# Patient Record
Sex: Female | Born: 1985 | Race: White | Hispanic: No | Marital: Single | Smoking: Never smoker
Health system: Southern US, Community
[De-identification: ages and names within clinical notes are randomized; demographics above are authoritative.]

## PROBLEM LIST (undated history)

## (undated) DIAGNOSIS — F329 Major depressive disorder, single episode, unspecified: Secondary | ICD-10-CM

## (undated) DIAGNOSIS — F32A Depression, unspecified: Secondary | ICD-10-CM

---

## 2007-08-01 ENCOUNTER — Emergency Department (HOSPITAL_COMMUNITY): Admission: EM | Admit: 2007-08-01 | Discharge: 2007-08-01 | Payer: Self-pay | Admitting: Family Medicine

## 2007-08-11 ENCOUNTER — Other Ambulatory Visit: Admission: RE | Admit: 2007-08-11 | Discharge: 2007-08-11 | Payer: Self-pay | Admitting: Obstetrics & Gynecology

## 2007-09-18 ENCOUNTER — Emergency Department (HOSPITAL_COMMUNITY): Admission: EM | Admit: 2007-09-18 | Discharge: 2007-09-18 | Payer: Self-pay | Admitting: Emergency Medicine

## 2008-03-27 ENCOUNTER — Inpatient Hospital Stay (HOSPITAL_COMMUNITY): Admission: EM | Admit: 2008-03-27 | Discharge: 2008-03-28 | Payer: Self-pay | Admitting: Emergency Medicine

## 2008-03-28 ENCOUNTER — Ambulatory Visit: Payer: Self-pay | Admitting: Psychiatry

## 2008-03-28 ENCOUNTER — Inpatient Hospital Stay (HOSPITAL_COMMUNITY): Admission: RE | Admit: 2008-03-28 | Discharge: 2008-03-29 | Payer: Self-pay | Admitting: Psychiatry

## 2008-04-13 ENCOUNTER — Ambulatory Visit (HOSPITAL_COMMUNITY): Payer: Self-pay | Admitting: Marriage and Family Therapist

## 2008-04-19 ENCOUNTER — Ambulatory Visit (HOSPITAL_COMMUNITY): Payer: Self-pay | Admitting: Marriage and Family Therapist

## 2008-04-28 ENCOUNTER — Ambulatory Visit (HOSPITAL_COMMUNITY): Payer: Self-pay | Admitting: Marriage and Family Therapist

## 2008-05-04 ENCOUNTER — Ambulatory Visit (HOSPITAL_COMMUNITY): Payer: Self-pay | Admitting: Marriage and Family Therapist

## 2008-05-12 ENCOUNTER — Ambulatory Visit (HOSPITAL_COMMUNITY): Payer: Self-pay | Admitting: Marriage and Family Therapist

## 2008-05-22 ENCOUNTER — Ambulatory Visit (HOSPITAL_COMMUNITY): Payer: Self-pay | Admitting: Marriage and Family Therapist

## 2008-06-07 ENCOUNTER — Ambulatory Visit (HOSPITAL_COMMUNITY): Payer: Self-pay | Admitting: Marriage and Family Therapist

## 2008-06-14 ENCOUNTER — Ambulatory Visit (HOSPITAL_COMMUNITY): Payer: Self-pay | Admitting: Marriage and Family Therapist

## 2008-06-26 ENCOUNTER — Ambulatory Visit (HOSPITAL_COMMUNITY): Payer: Self-pay | Admitting: Marriage and Family Therapist

## 2008-07-10 ENCOUNTER — Ambulatory Visit (HOSPITAL_COMMUNITY): Payer: Self-pay | Admitting: Marriage and Family Therapist

## 2008-07-26 ENCOUNTER — Ambulatory Visit (HOSPITAL_COMMUNITY): Payer: Self-pay | Admitting: Marriage and Family Therapist

## 2008-08-09 ENCOUNTER — Ambulatory Visit (HOSPITAL_COMMUNITY): Payer: Self-pay | Admitting: Marriage and Family Therapist

## 2008-08-23 ENCOUNTER — Ambulatory Visit (HOSPITAL_COMMUNITY): Payer: Self-pay | Admitting: Marriage and Family Therapist

## 2008-09-06 ENCOUNTER — Ambulatory Visit (HOSPITAL_COMMUNITY): Payer: Self-pay | Admitting: Marriage and Family Therapist

## 2008-09-18 ENCOUNTER — Ambulatory Visit (HOSPITAL_COMMUNITY): Payer: Self-pay | Admitting: Marriage and Family Therapist

## 2008-09-28 ENCOUNTER — Other Ambulatory Visit: Admission: RE | Admit: 2008-09-28 | Discharge: 2008-09-28 | Payer: Self-pay | Admitting: Obstetrics and Gynecology

## 2008-11-02 ENCOUNTER — Ambulatory Visit (HOSPITAL_COMMUNITY): Payer: Self-pay | Admitting: Marriage and Family Therapist

## 2008-11-30 ENCOUNTER — Ambulatory Visit (HOSPITAL_COMMUNITY): Payer: Self-pay | Admitting: Marriage and Family Therapist

## 2009-01-22 ENCOUNTER — Ambulatory Visit (HOSPITAL_COMMUNITY): Payer: Self-pay | Admitting: Licensed Clinical Social Worker

## 2009-11-08 IMAGING — CR DG CHEST 1V PORT
1 series · 1 of 1 positions shown · non-contrast
Comparison: none

CLINICAL DATA: Chest pain

Portable chest at [DATE]:
No previous for comparison. The heart size and mediastinal contours are within
normal limits.  Both lungs are clear.  The visualized skeletal structures are
unremarkable.

[view not recorded]
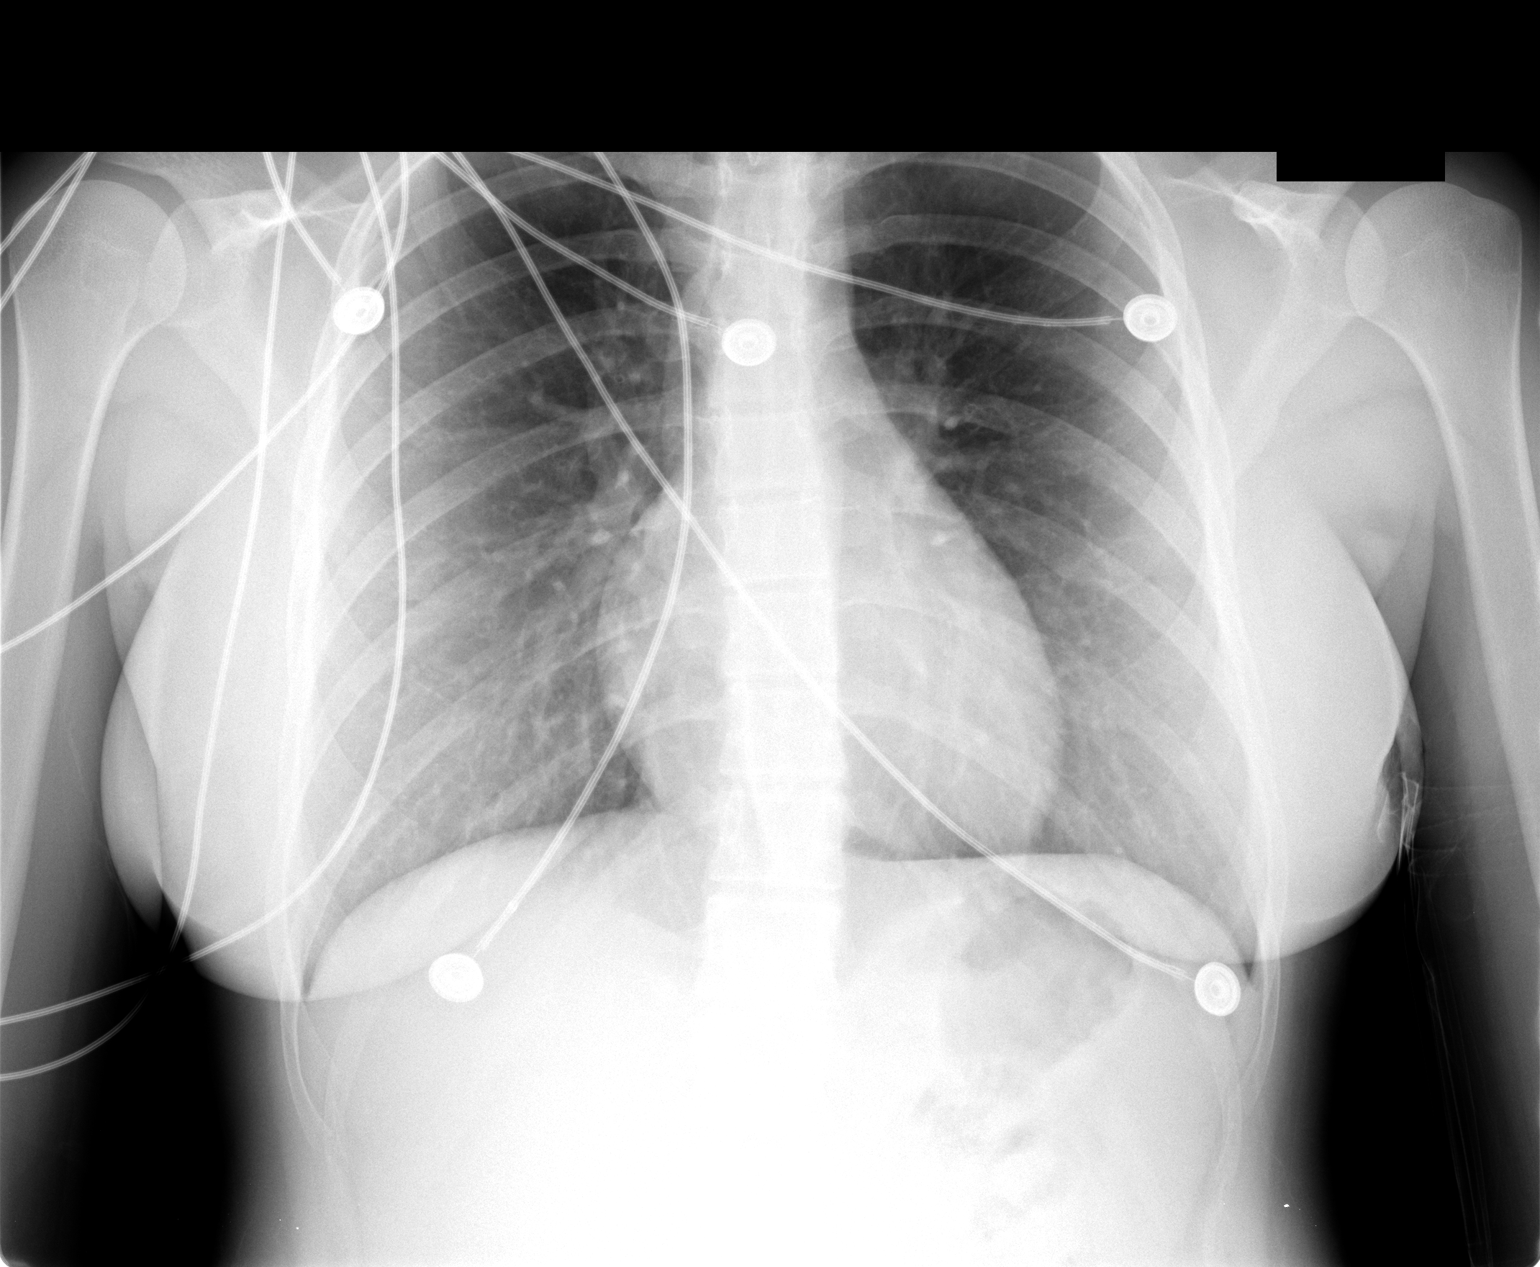

[1 of 1 positions shown; findings below may reference images not displayed]

IMPRESSION: 1. No active cardiopulmonary disease.

## 2009-12-26 IMAGING — CR DG FOOT COMPLETE 3+V*R*
3 series · 3 of 3 positions shown · non-contrast
Comparison: none

CLINICAL DATA: Fell

[t foot ap right]
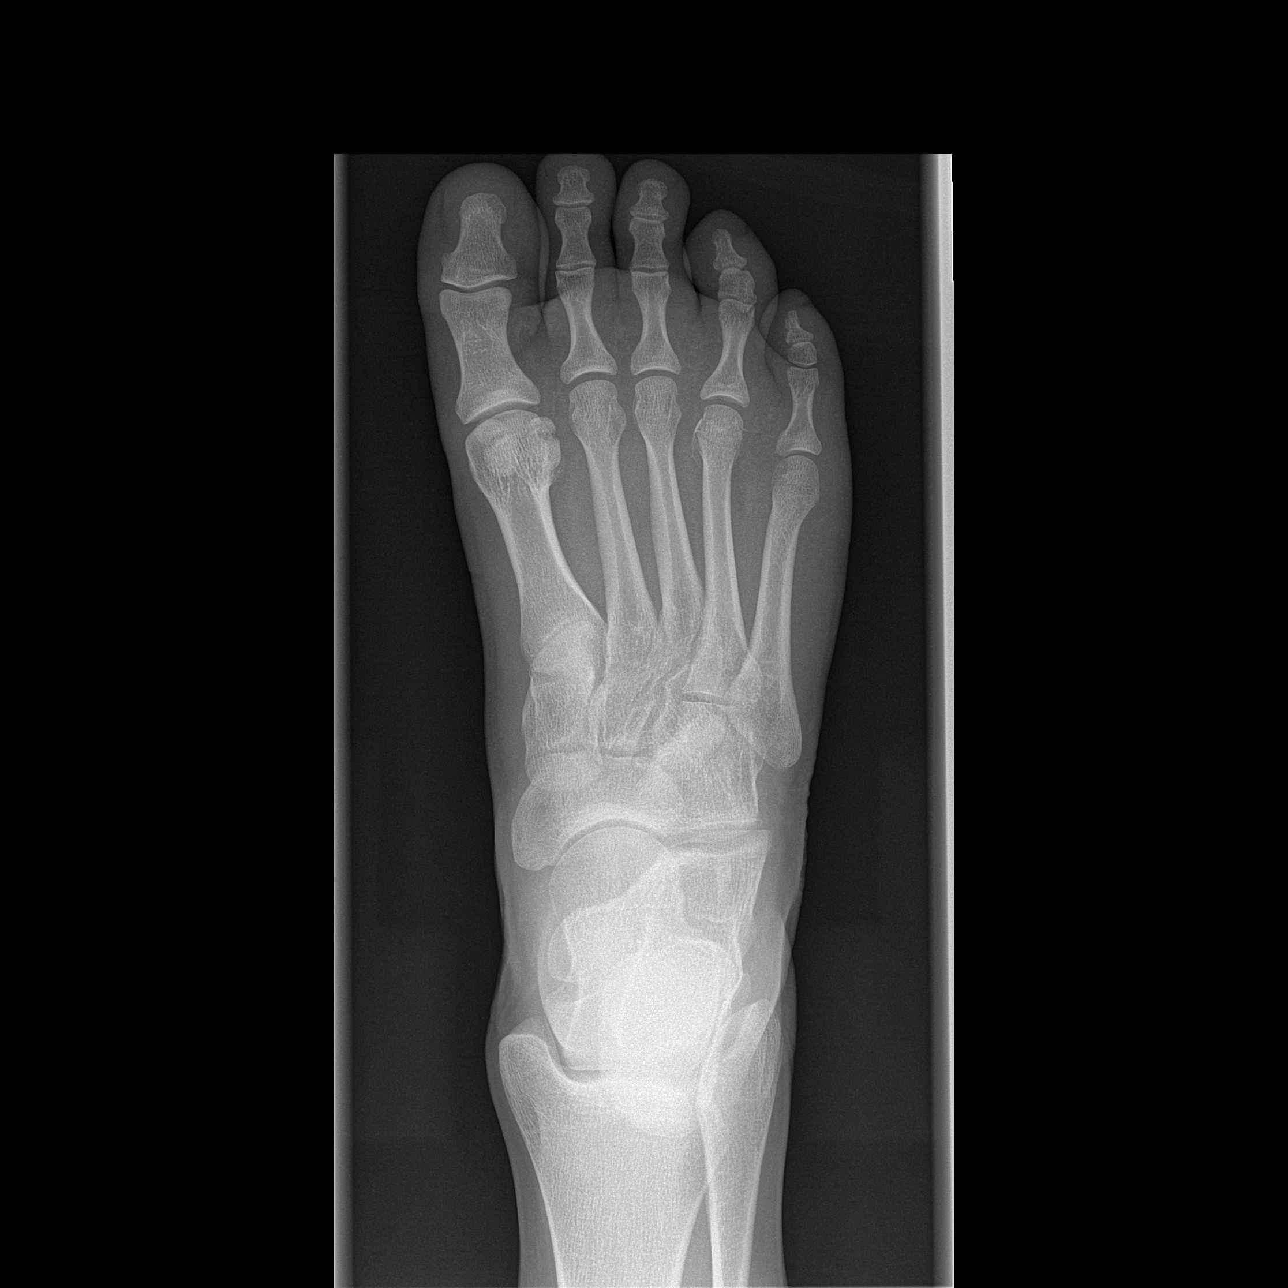

[t foot oblique right]
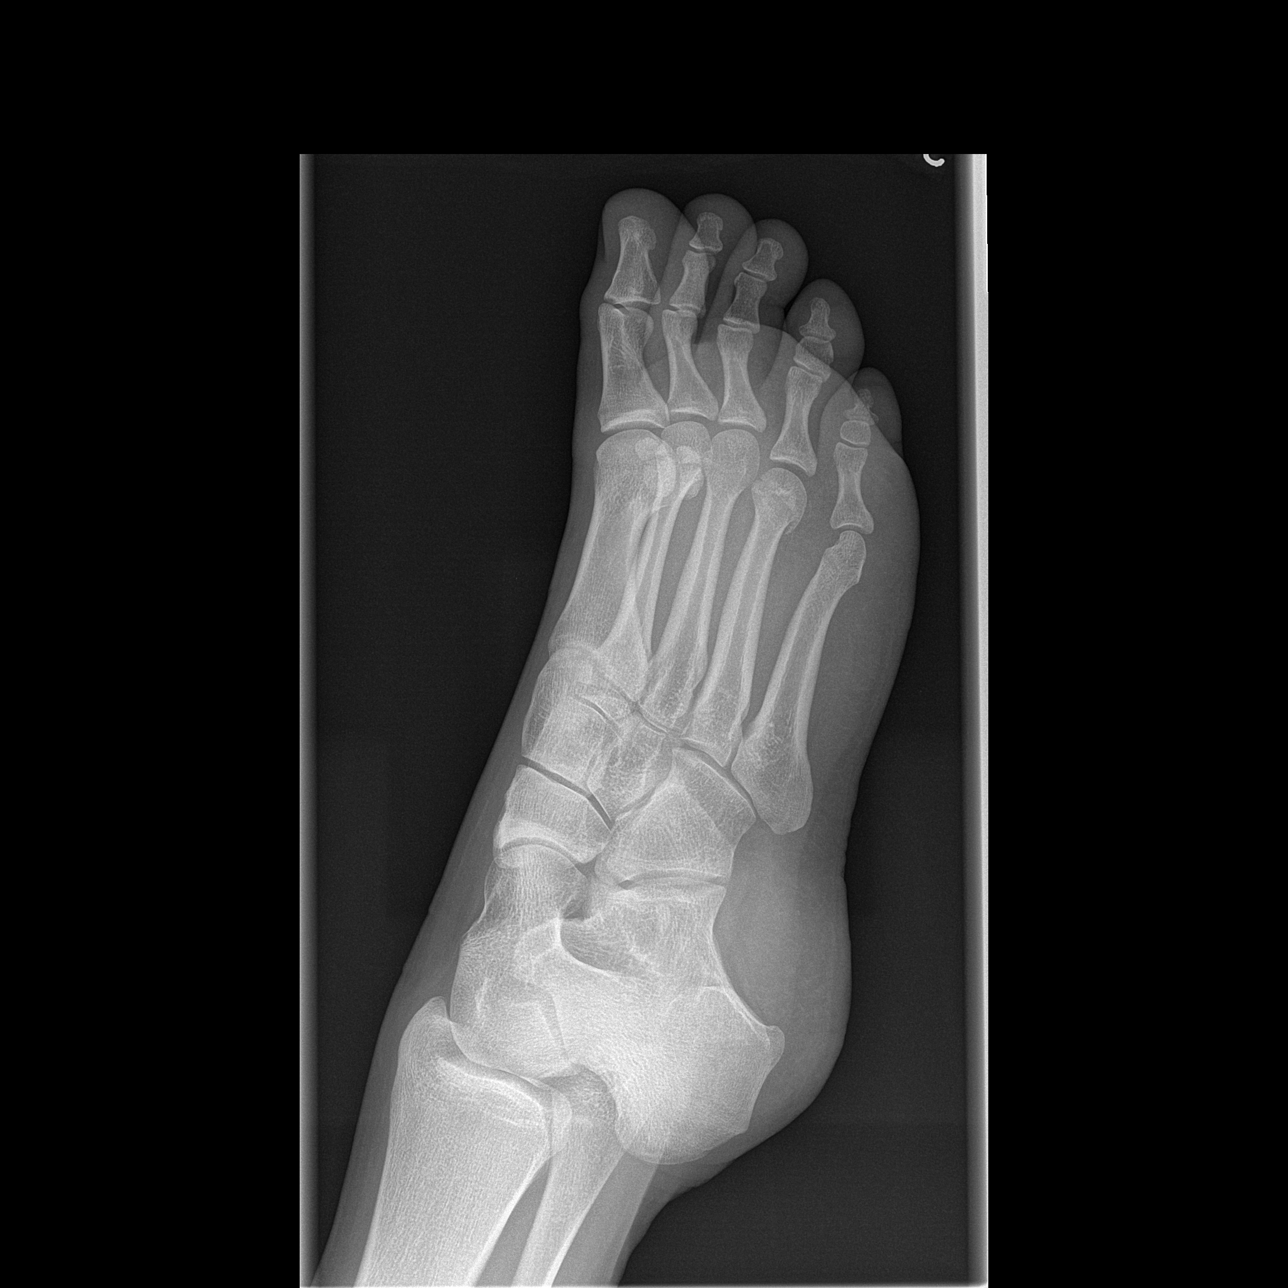

[t foot lat right *]
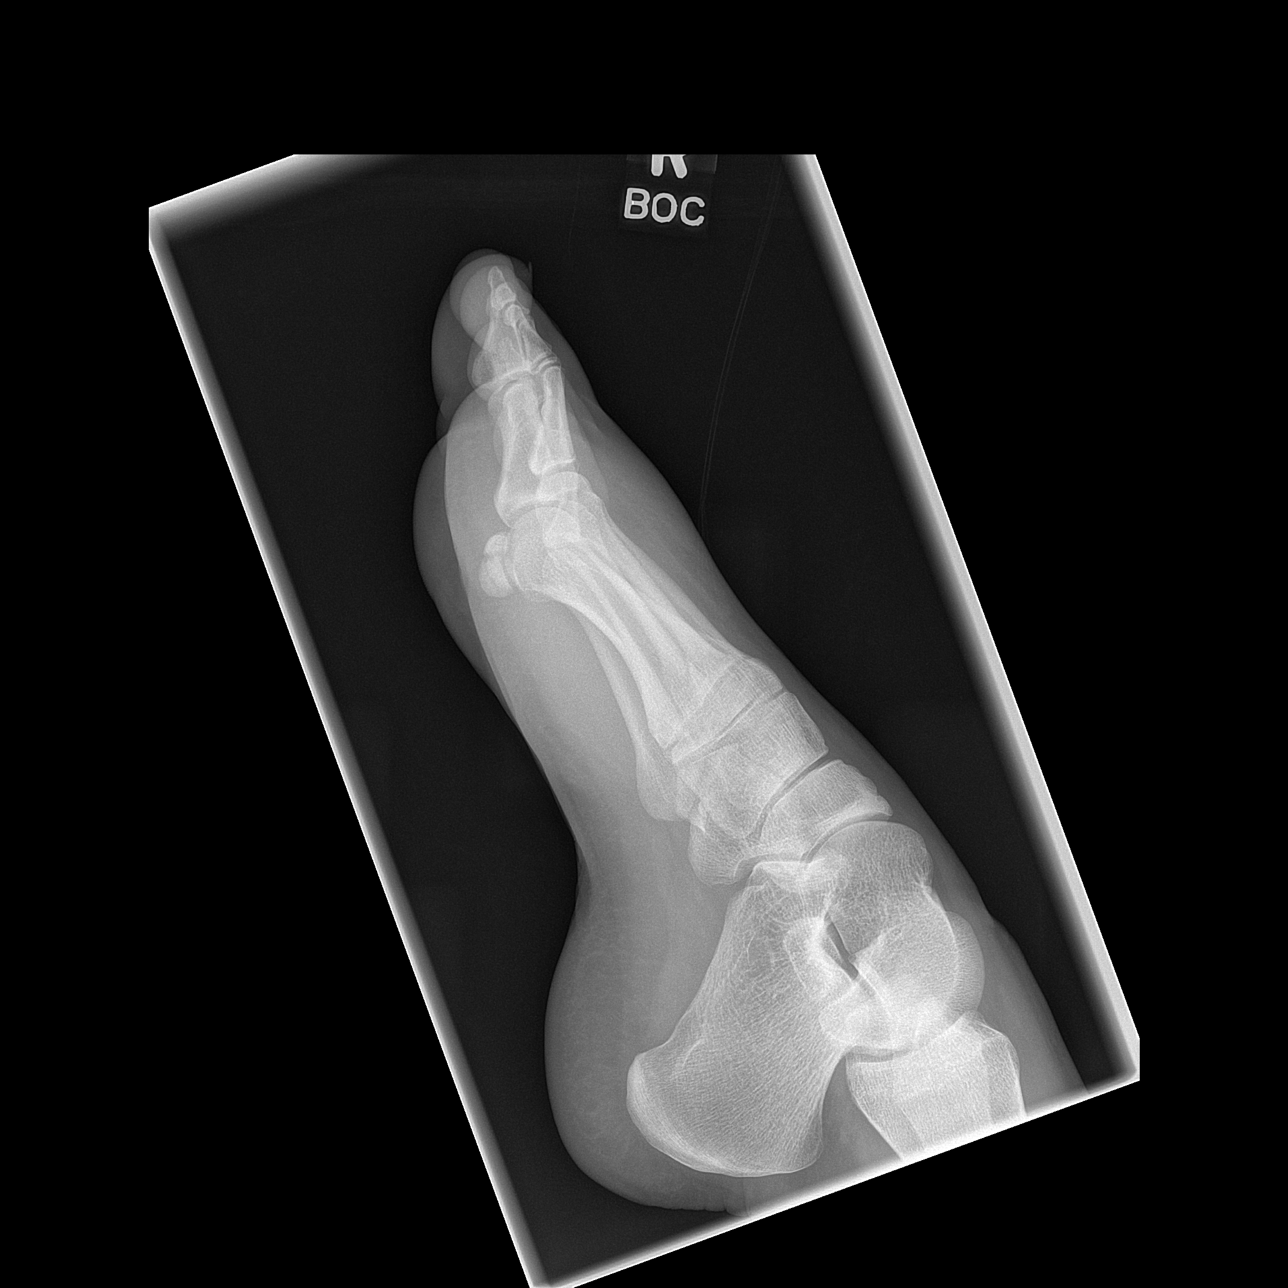

[3 of 3 positions shown; findings below may reference images not displayed]

Right foot three-view:

No previous for comparison. There is a minimally comminuted transverse fracture
across the head of the fourth metatarsal, with less than 1 mm displacement of
major fracture fragments. No significant angulation deformity. No other bone
abnormality seen. Normal alignment and mineralization. No significant
degenerative change.
IMPRESSION: 1. Minimally displaced fracture, head of the fourth metatarsal.
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
## 2011-02-04 NOTE — Discharge Summary (Signed)
Jillian [name], Jillian [name]                   ACCOUNT NO.:  [id number]   MEDICAL RECORD NO.:  [id number]          PATIENT TYPE:  INP   LOCATION:  1503                         FACILITY:  [facility]   PHYSICIAN:  Eduard Clos, MDDATE OF BIRTH:  1986-05-04   DATE OF ADMISSION:  03/26/2008  DATE OF DISCHARGE:                               DISCHARGE SUMMARY   COURSE IN THE HOSPITAL:  A 25 year old female with known history of  depression and chronic urticaria was brought in the ER after patient had  taken 5 tablets of Unisom.  Patient was admitted to medical floor.  On  admission, patient's LFTs were found to be mildly elevated at around AST  being 63 and ALT 42 with a total bilirubin of 1.8 and PT/INR is 13.4 and  1.  Acetaminophen levels and salicylates and are nondetectable.  Pregnancy screen was negative.  Since patient used to take off and on  Tylenol level p.r.n., poison control was contacted and poison control  advised to start her on Mucomyst orally.  Patient was placed on 1:1 with  a sitter with suicide precautions.  Psychiatry consult obtain per Dr.  Jeanie Sewer psychiatrist.  Patient to be transferred once medically stable  to behavioral health.  An EKG also was done which did not show any acute  findings.  Patient had a normal sinus rhythm in the EKG.  Subsequent to  age, patient's AST and ATL became normal and PT/INR was also normal and  have contacted poison control who advised the Mucomyst could be  discontinued during this day.  The Mucomyst was changed to IV because  she was not able to take orally.  At this time, patient is having no  nausea, vomiting or any abdominal pain.  Patient also had mild skin rash  in the both knee area and in the fingers for which an additional dose of  hydroxyzine was given.  The patient did not complain of any difficulty  swallowing or tongue swelling or breathing problems.  After the dose of  hydroxyzine which was given additionally to her regular  scheduled dose,  her rash became better and there was no itching.  At this time, patient  is medically stable to be transferred to behavioral health for further  management.   FINAL DIAGNOSES:  1. Status post Unisom overdose.  2. Abnormal liver function test which has improved.  3. Chronic urticaria.  4. Depression.   MEDICATION AT DISCHARGE:  1. Lexapro 20 mg p.o. daily.  2. Hydroxyzine 25 mg p.o. 3 times daily and 15 mg p.o. at bedtime.   PLAN:  Patient is to be transferred to behavioral health for further  management per Dr. Jeanie Sewer psychiatrist.  Patient's family aware of  the plan.      Eduard Clos, MD  Electronically Signed     ANK/MEDQ  D:  03/28/2008  T:  03/28/2008  Job:  [id number]

## 2011-02-04 NOTE — Consult Note (Signed)
[name], [name]                   ACCOUNT NO.:  [id number]   MEDICAL RECORD NO.:  [id number]          PATIENT TYPE:  INP   LOCATION:  1503                         FACILITY:  [facility]   PHYSICIAN:  Antonietta Breach, M.D.  DATE OF BIRTH:  05-09-1986   DATE OF CONSULTATION:  03/27/2008  DATE OF DISCHARGE:                                 CONSULTATION   ADDENDUM   FAMILY PSYCHIATRIC HISTORY:  None known.   SOCIAL HISTORY:  Please see the above.  The patient was adopted.  She is  single.  She has no children.  She is a Consulting civil engineer at [employer] in  history.  She denies alcohol or illegal drugs.   PAST MEDICAL HISTORY:  Status post overdose and she is on the Mucomyst  protocol.   MEDICATIONS:  The MAR is reviewed.  The patient is on the Mucomyst  protocol, Lexapro 10 mg daily.  She has no known drug allergies.   LABORATORY DATA:  Aspirin negative.  Magnesium within normal limits.  Sodium 136, BUN 9, creatinine 0.64, glucose 129.  SGOT has fallen from  63 down to 28, SGPT has fallen from 42 down to 36.  INR normal at 1.0.  WBC 6.9, hemoglobin 12.1, platelet count 246.  Alcohol negative.  Tylenol negative.  Urine drug screen negative.  Urine pregnancy test  negative.   REVIEW OF SYSTEMS:  Constitutional, head, eyes, ears, nose, throat,  mouth, neurologic, psychiatric, cardiovascular, respiratory,  gastrointestinal, genitourinary, skin, musculoskeletal, hematologic,  lymphatic, endocrine, metabolic all unremarkable.   EXAMINATION:  VITAL SIGNS:  Temperature 98.6, pulse 83, respiratory rate  18, blood pressure 116/73, O2 saturation on room air 99%.  GENERAL APPEARANCE:  Jillian [name] is a young female lying in a supine  position in her hospital bed with no abnormal involuntary movements.  MENTAL STATUS EXAM:  Jillian [name] is alert.  Her eye contact is good.  Her  attention span is within normal limits.  Her concentration is mildly  decreased.  Her affect is constricted with tears.  Her mood is  very  depressed.  She is oriented to all spheres.  Her memory is intact to  immediate, recent and remote.  Her fund of knowledge and intelligence  are within normal limits.  Her speech involves normal rate and prosody  without dysarthria.  Thought process is logical, coherent, goal-  directed.  No looseness of associations.  Thought content:  Jillian [name]  acknowledges overdose intent.  She also is in denial about her fiance's  breakup with her.  She states that she would not be able to handle that.  Her insight is poor.  Her judgment is impaired.   ASSESSMENT:  AXIS I:  1. Code 293.83 mood disorder not otherwise specified, depressed.  2. Rule out code 296.33 major depressive disorder, recurrent, severe.  3. Code 293.84 anxiety disorder not otherwise specified.  AXIS II:  Deferred.  AXIS III:  Status post overdose.  AXIS IV:  Primary support group.  AXIS V:  30.   [name] is still at risk to harm herself.  She  has impaired judgment.  She requires admission to a psychiatric unit for further evaluation and  treatment.   RECOMMENDATIONS:  1. Would continue the sitter for suicide precautions.  2. Would admit to a psychiatric inpatient unit for further evaluation      and treatment as soon as possible.  3. No psychotropic changes are recommended at this time.   The patient will probably require a change of her medication during  further evaluation.   Of note, once the patient is back as an outpatient she could benefit  from cognitive behavioral therapy combined with deep breathing and  progressive muscle relaxation for her anxiety condition.      Antonietta Breach, M.D.  Electronically Signed     JW/MEDQ  D:  03/27/2008  T:  03/27/2008  Job:  [id number]

## 2011-02-04 NOTE — H&P (Signed)
Jillian [name], Jillian [name] NO.:  [id number]   MEDICAL RECORD NO.:  [id number]          PATIENT TYPE:  INP   LOCATION:  0103                         FACILITY:  [facility]   PHYSICIAN:  Vania Rea, M.D. DATE OF BIRTH:  10/31/1985   DATE OF ADMISSION:  03/26/2008  DATE OF DISCHARGE:                              HISTORY & PHYSICAL   PRIMARY CARE PHYSICIAN:  Production assistant, radio.   CHIEF COMPLAINT:  Unisom overdose.   HISTORY OF PRESENT ILLNESS:  This is a 25 year old Caucasian lady who  reports that she took 5 tablets of Unisom yesterday evening in order to  get to sleep.  Unclear if she was attempting an overdose.  In any case,  she became woozy, and her mother brought her to the emergency room.  In  the emergency room, she had toxicology labs drawn.  Her Tylenol level is  undetectable.  However, her liver function tests were slightly abnormal.  It has been noted that the patient reports she did not overdose on  Tylenol, but she takes Tylenol approximately 1 or 2 days per month to  help with menstrual pain.  Poison Control was contacted, and they report  that since liver functions are abnormal and she did come in with an  overdose, even though the overdose was not reportedly of Tylenol, it  would be best to admit her and treat with acetylcysteine in case the  history is faulty.  Hospitalist service was called to assist with her  management.   The patient denied nausea, vomiting, diarrhea, or constipation.  She  denied suicidal ideation.   PAST MEDICAL HISTORY:  1. Anxiety.  2. Depression.  3. Angioedema.   MEDICATIONS:  1. Lexapro 20 mg daily.  2. Hydroxyzine 25 mg 4 times daily p.r.n.   ALLERGIES:  No known drug allergies.   SOCIAL HISTORY:  She denies tobacco, alcohol, or illicit drug use.   FAMILY HISTORY:  Unknown, since she is adopted.   REVIEW OF SYSTEMS:  Other than noted above, a 10-point review of systems  is negative.   PHYSICAL  EXAMINATION:  GENERAL:  A comfortable, alert, slightly obese  young Caucasian female, lying on the bed.  VITAL SIGNS:  Her temperature is 99.1, pulse 100, respirations 20, blood  pressure 132/60.  She is saturating 99% on room air.  HEENT:  Pupils are round and equal.  Mucous membranes are pink and  anicteric.  NECK:  She has no jugular venous distention, no thyromegaly, no carotid  bruit.  CHEST:  Clear to auscultation bilaterally.  CARDIOVASCULAR:  Regular rhythm.  No murmur.  ABDOMEN:  Obese, soft, and nontender.  EXTREMITIES:  Without edema.  She has no bone or joint deformity.  NEUROLOGIC:  Cranial nerves II-XII are grossly intact, and she has no  focal neurologic deficits.   LABORATORIES:  Her CBC is essentially normal.  Her chemistry is normal.  It was initially reported with a potassium of 6.2, but the repeat was  3.7.  Her AST is 63, her ALT is 42, and her bilirubin is 1.8.  ASSESSMENT:  1. Unisom overdose.  2. History of anxiety and depression.  3. Abnormal liver function tests.   PLAN:  Per [facility], will admit this lady for 24 hours of  intravenous Unisom, and will monitor her liver functions.  In view of  her deranged liver function, will reduce the dose as appropriate.  Other  plans as per orders.      Vania Rea, M.D.  Electronically Signed     LC/MEDQ  D:  03/27/2008  T:  03/27/2008  Job:  [id number]   cc:   [facility] [place]  Fax: [phone]

## 2011-02-04 NOTE — Discharge Summary (Signed)
Jillian [name], Jillian [name]                   ACCOUNT NO.:  [id number]   MEDICAL RECORD NO.:  [id number]          PATIENT TYPE:  IPS   LOCATION:  0302                          FACILITY:  [facility]   PHYSICIAN:  Geoffery Lyons, M.D.      DATE OF BIRTH:  Apr 29, 1986   DATE OF ADMISSION:  03/28/2008  DATE OF DISCHARGE:  03/29/2008                               DISCHARGE SUMMARY   IDENTIFYING INFORMATION:  This is a 25 year old single Caucasian female.  This is a voluntary admission.   HISTORY OF PRESENT ILLNESS:  First inpatient psychiatric admission for  this 25 year old, who presented in the emergency room after taking 5  tablets of Unisom on the evening of July 4 in order to go to sleep.  She  reports that earlier that day she had broken up with her fiance of 1-1/2  years.  He is getting ready to enter the [employer], and he called off their  planned wedding, and said that she would not be hearing from him.  She  said this is not the first time that they have broken up.  She feels  that he is overwhelmed by plans to enter the [employer] with plans for them to  marry after he gets out of basic training.  They were making plans for  the wedding.  They have been getting along well and said that they have  been together for the past year and a half.  On the day of the breakup,  they had had a disagreement and she was not angry with him, but he was  yelling a lot at her.  She felt that he was under a lot of stress.  She  said that through the day she was crying pretty continuously, could not  calm down, took 5 Unisoms, which was  a little more than twice her usual  tablets that she took at night to go to sleep.  She denies that she ever  had any intentions of killing and that this was never in her mind.  The  patient was admitted to the Medical Unit from July 5 to July 7 due to  initial mild elevation of her liver enzymes, so was admitted to rule out  a Tylenol overdose.  Tylenol level remained normal throughout the  stay.  She also had been initially hyperkalemic, which was normal on recheck.  She was transferred to Psychiatric Service on July 7.   PAST PSYCHIATRIC HISTORY:  Followup by Dr. Illene Regulus at [facility].  Said that she has been taking  Lexapro 20 mg for the past 3 years, that it has always worked well for  her.  Denies any history of suicide attempts.  Endorsed that she has a  history of some depression starting in the 8th grade, at which time she  was started on Prozac in [place], [place].  At that time, had  problems with crying all the time, being irritable and reclusive.  At  that time, also was doing some cutting of herself in order to cope and  has a history of cutting from the 7th to the 9th grade.  She has one  prior admission at age 53 to [facility] in [place], [place]  for depression and irritability.  Denies a history of substance abuse  ever.   SOCIAL HISTORY:  Single, Caucasian female, who was adopted.  She is very  close to her adoptive mother.  She completed high school at 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  No history of learning disability.  Her  parents live in [place], [place].  She has a younger step-  sister that she is close to.  No legal problems.   FAMILY HISTORY:  Not known, adopted.   MEDICAL HISTORY:  Followed at [facility].   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   CURRENT MEDICATIONS:  Lexapro 20 mg daily.   DRUG ALLERGIES:  None.   PHYSICAL EXAMINATION:  Done in the emergency room and is noted in the  record.  This is a medium build, Caucasian female in no distress.  Pleasant, cooperative and fully alert.  5 feet 4 inches tall, 157  pounds.  Temperature 99.2, pulse 116, respirations 118, blood pressure  134/79.   POSITIVE PHYSICAL  FINDINGS:  CBC, CMET and liver enzymes were all within  normal limits.   MENTAL STATUS EXAM:  Fully alert female in full contact with reality,  polite.  Has mildly anxious affect.  Speech is normal.  She gives a  coherent history, detailed.  She is articulate.  Mood is neutral,  hopeful, but is candid about the fact that if the boyfriend does not  come back to her, she is going to grieve the relationship, categorically  denying any suicidal thoughts.  Thought process logical and coherent.  No evidence of suicidal thought today.  No homicidal thought.  Insight  is good.  Impulse control and judgment within normal limits.  Cognition  is fully preserved.   Axis I:  Acute adjustment reaction with underlying mood disorder.  Axis II:  Deferred.  Axis III:  No diagnosis.  Axis IV:  Severe relationship crisis.  Axis V:  Current 59.  Past year 27.   PLAN:  Discharge the patient today.  A social worker has spoken with her  mother, who would like to take her home.  Tyra, herself, has said that  she feels much stronger when her mother is around, feels safe at home,  and the environment here on the unit has added to her anxiety.  She  categorically denies any suicidal thought.  She is going to be staying  with her mother, who is going to be there to support her.  She is going  to follow up in an intensive outpatient program and will start there  tomorrow morning.      Margaret A. Scott, N.P.      Geoffery Lyons, M.D.  Electronically Signed    MAS/MEDQ  D:  03/30/2008  T:  03/30/2008  Job:  [id number]

## 2011-06-19 LAB — COMPREHENSIVE METABOLIC PANEL
ALT: 35
ALT: 36 — ABNORMAL HIGH
AST: 23
Albumin: 3.2 — ABNORMAL LOW
Alkaline Phosphatase: 36 — ABNORMAL LOW
Alkaline Phosphatase: 37 — ABNORMAL LOW
BUN: 7
Calcium: 9
Calcium: 9.1
Chloride: 104
Creatinine, Ser: 0.64
Creatinine, Ser: 0.64
GFR calc non Af Amer: >60
GFR calc non Af Amer: >60
Glucose, Bld: 129 — ABNORMAL HIGH
Sodium: 139

## 2011-06-19 LAB — CBC
HCT: 40.6
Hemoglobin: 12.1
MCHC: 33.2
Platelets: 246
Platelets: 262
RBC: 4.36
RBC: 4.86
RDW: 12.8
WBC: 10.9 — ABNORMAL HIGH

## 2011-06-19 LAB — BASIC METABOLIC PANEL
CO2: 21
Chloride: 104
Creatinine, Ser: 0.71
GFR calc non Af Amer: >60
Glucose, Bld: 84
Sodium: 136

## 2011-06-19 LAB — PROTIME-INR
INR: 1
Prothrombin Time: 13.4

## 2011-06-19 LAB — DIFFERENTIAL
Basophils Absolute: 0
Basophils Relative: 0
Eosinophils Absolute: 0
Eosinophils Relative: 0
Monocytes Absolute: 0.4
Monocytes Relative: 4

## 2011-06-19 LAB — HEPATIC FUNCTION PANEL
ALT: 42 — ABNORMAL HIGH
Albumin: 4
Indirect Bilirubin: 1 — ABNORMAL HIGH
Total Protein: 7.4

## 2011-06-19 LAB — SALICYLATE LEVEL: Salicylate Lvl: <4

## 2011-06-19 LAB — MAGNESIUM: Magnesium: 2.2

## 2011-06-19 LAB — ETHANOL: Alcohol, Ethyl (B): <5

## 2011-06-19 LAB — RAPID URINE DRUG SCREEN, HOSP PERFORMED: Barbiturates: NOT DETECTED

## 2011-06-19 LAB — ACETAMINOPHEN LEVEL: Acetaminophen (Tylenol), Serum: <10 — ABNORMAL LOW

## 2011-07-01 LAB — DIFFERENTIAL
Basophils Absolute: 0
Eosinophils Absolute: 0.1
Eosinophils Relative: 2
Monocytes Relative: 6
Neutro Abs: 5.6
Neutrophils Relative %: 70

## 2011-07-01 LAB — CBC
MCHC: 33.8
RDW: 14.2 — ABNORMAL HIGH
WBC: 8

## 2011-07-01 LAB — POCT CARDIAC MARKERS
CKMB, poc: <1 — ABNORMAL LOW
Operator id: 3206
Troponin i, poc: <0.05
Troponin i, poc: <0.05

## 2011-07-01 LAB — COMPREHENSIVE METABOLIC PANEL
ALT: 15
AST: 16
Albumin: 3.7
BUN: 12
Creatinine, Ser: 0.67
GFR calc Af Amer: >60
Glucose, Bld: 101 — ABNORMAL HIGH
Sodium: 135

## 2011-07-01 LAB — PROTIME-INR
INR: 1
Prothrombin Time: 13

## 2011-07-01 LAB — APTT: aPTT: 33

## 2018-05-24 ENCOUNTER — Ambulatory Visit: Payer: Self-pay | Admitting: Nurse Practitioner

## 2018-05-24 ENCOUNTER — Encounter: Payer: Self-pay | Admitting: Nurse Practitioner

## 2018-05-24 VITALS — BP 118/84 | HR 95 | Temp 98.4°F | Resp 20 | Ht 66.0 in | Wt 224.0 lb

## 2018-05-24 DIAGNOSIS — N390 Urinary tract infection, site not specified: Secondary | ICD-10-CM

## 2018-05-24 DIAGNOSIS — R300 Dysuria: Secondary | ICD-10-CM

## 2018-05-24 LAB — POCT URINALYSIS DIP (MANUAL ENTRY)
BILIRUBIN UA: NEGATIVE
BILIRUBIN UA: NEGATIVE mg/dL
GLUCOSE UA: NEGATIVE mg/dL
Nitrite, UA: NEGATIVE
Protein Ur, POC: 100 mg/dL — AB
SPEC GRAV UA: 1.02 (ref 1.010–1.025)
Urobilinogen, UA: 0.2 E.U./dL
pH, UA: 6 (ref 5.0–8.0)

## 2018-05-24 MED ORDER — SULFAMETHOXAZOLE-TRIMETHOPRIM 800-160 MG PO TABS: 1.0000 | ORAL_TABLET | Freq: Two times a day (BID) | ORAL | 0 refills | Status: AC

## 2018-05-24 MED ORDER — PHENAZOPYRIDINE HCL 100 MG PO TABS: 100.0000 mg | ORAL_TABLET | Freq: Three times a day (TID) | ORAL | 0 refills | Status: DC | PRN

## 2018-05-24 MED ORDER — PHENAZOPYRIDINE HCL 100 MG PO TABS: 100.0000 mg | ORAL_TABLET | Freq: Three times a day (TID) | ORAL | 0 refills | Status: AC | PRN

## 2018-05-24 MED ORDER — SULFAMETHOXAZOLE-TRIMETHOPRIM 800-160 MG PO TABS: 1.0000 | ORAL_TABLET | Freq: Two times a day (BID) | ORAL | 0 refills | Status: DC

## 2018-05-24 NOTE — Progress Notes (Signed)
Subjective:    Jillian [name] is a 32 y.o. female who complains of dysuria, frequency, hesitancy, suprapubic pressure and urgency since  12pm today.  Patient also complains of chills and suprapubic pressure.. Patient denies back pain, congestion, cough and fever.  Patient does have a history of recurrent UTI.  Patient does not have a history of pyelonephritis.  The patient has taken Azo since the onset of her symptoms, and says she has little relief.   The following portions of the patient's history were reviewed and updated as appropriate: allergies, current medications and past medical history.  Review of Systems Constitutional: positive for chills, negative for anorexia, fatigue, fevers and malaise Ears, nose, mouth, throat, and face: negative Respiratory: negative Cardiovascular: negative Gastrointestinal: positive for nausea, negative for abdominal pain, change in bowel habits, constipation, diarrhea and vomiting Genitourinary:positive for dysuria, frequency and urgency, negative for vaginal discharge, decreased stream, hematuria, nocturia and urinary incontinence Neurological: negative    Objective:    There were no vitals taken for this visit. General: alert, cooperative and no distress  Abdomen: soft, non-tender, without masses or organomegaly and + suprapubic pressure in the entire abdomen  Back: back muscles are full ROM, CVA tenderness absent  GU: defer exam   Laboratory:  Urine dipstick shows sp gravity 1.020, negative for glucose, 3+ for hemoglobin, negative for ketones, 1+ for leukocyte esterase, negative for nitrites, 2+ for protein and 1+ for urobilinogen.   Micro exam: not done.    Assessment:    Acute cystitis    Plan:  Exam findings, diagnosis etiology and medication use and indications reviewed with patient. Follow- Up and discharge instructions provided. No emergent/urgent issues found on exam.  Patient education was provided. Patient verbalized understanding of  information provided and agrees with plan of care (POC), all questions answered. The patient is advised to call or return to clinic if he does not see an improvement in symptoms, or to seek the care of the closest emergency department if he worsens with the above plan.   1. Dysuria  - POCT urinalysis dipstick - phenazopyridine (PYRIDIUM) 100 MG tablet; Take 1 tablet (100 mg total) by mouth 3 (three) times daily as needed for up to 3 days for pain.  Dispense: 10 tablet; Refill: 0 - sulfamethoxazole-trimethoprim (BACTRIM DS) 800-160 MG tablet; Take 1 tablet by mouth 2 (two) times daily for 3 days.  Dispense: 6 tablet; Refill: 0 -Take medications as prescribed. -Ibuprofen or Tylenol for pain, fever or general discomfort. -Increase fluids.  Avoid caffeine to include, sodas, teas and coffee. -Toileting schedule to toilet at least every 2 hours. -Void approximately 15-20 minutes after sexual intercourse. -Follow up as needed.  2. Urinary tract infection without hematuria, site unspecified  - phenazopyridine (PYRIDIUM) 100 MG tablet; Take 1 tablet (100 mg total) by mouth 3 (three) times daily as needed for up to 3 days for pain.  Dispense: 10 tablet; Refill: 0 - sulfamethoxazole-trimethoprim (BACTRIM DS) 800-160 MG tablet; Take 1 tablet by mouth 2 (two) times daily for 3 days.  Dispense: 6 tablet; Refill: 0 -Take medications as prescribed. -Ibuprofen or Tylenol for pain, fever or general discomfort. -Increase fluids.  Avoid caffeine to include, sodas, teas and coffee. -Toileting schedule to toilet at least every 2 hours. -Void approximately 15-20 minutes after sexual intercourse. -Follow up as needed.

## 2018-05-24 NOTE — Patient Instructions (Signed)
Urinary Tract Infection, Adult -Take medications as prescribed. -Ibuprofen or Tylenol for pain, fever or general discomfort. -Increase fluids.  Avoid caffeine to include, sodas, teas and coffee. -Toileting schedule to toilet at least every 2 hours. -Void approximately 15-20 minutes after sexual intercourse. -Follow up as needed.  A urinary tract infection (UTI) is an infection of any part of the urinary tract, which includes the kidneys, ureters, bladder, and urethra. These organs make, store, and get rid of urine in the body. UTI can be a bladder infection (cystitis) or kidney infection (pyelonephritis). What are the causes? This infection may be caused by fungi, viruses, or bacteria. Bacteria are the most common cause of UTIs. This condition can also be caused by repeated incomplete emptying of the bladder during urination. What increases the risk? This condition is more likely to develop if:  You ignore your need to urinate or hold urine for long periods of time.  You do not empty your bladder completely during urination.  You wipe back to front after urinating or having a bowel movement, if you are female.  You are uncircumcised, if you are female.  You are constipated.  You have a urinary catheter that stays in place (indwelling).  You have a weak defense (immune) system.  You have a medical condition that affects your bowels, kidneys, or bladder.  You have diabetes.  You take antibiotic medicines frequently or for long periods of time, and the antibiotics no longer work well against certain types of infections (antibiotic resistance).  You take medicines that irritate your urinary tract.  You are exposed to chemicals that irritate your urinary tract.  You are female.  What are the signs or symptoms? Symptoms of this condition include:  Fever.  Frequent urination or passing small amounts of urine frequently.  Needing to urinate urgently.  Pain or burning with  urination.  Urine that smells bad or unusual.  Cloudy urine.  Pain in the lower abdomen or back.  Trouble urinating.  Blood in the urine.  Vomiting or being less hungry than normal.  Diarrhea or abdominal pain.  Vaginal discharge, if you are female.  How is this diagnosed? This condition is diagnosed with a medical history and physical exam. You will also need to provide a urine sample to test your urine. Other tests may be done, including:  Blood tests.  Sexually transmitted disease (STD) testing.  If you have had more than one UTI, a cystoscopy or imaging studies may be done to determine the cause of the infections. How is this treated? Treatment for this condition often includes a combination of two or more of the following:  Antibiotic medicine.  Other medicines to treat less common causes of UTI.  Over-the-counter medicines to treat pain.  Drinking enough water to stay hydrated.  Follow these instructions at home:  Take over-the-counter and prescription medicines only as told by your health care provider.  If you were prescribed an antibiotic, take it as told by your health care provider. Do not stop taking the antibiotic even if you start to feel better.  Avoid alcohol, caffeine, tea, and carbonated beverages. They can irritate your bladder.  Drink enough fluid to keep your urine clear or pale yellow.  Keep all follow-up visits as told by your health care provider. This is important.  Make sure to: ? Empty your bladder often and completely. Do not hold urine for long periods of time. ? Empty your bladder before and after sex. ? Wipe from front  to back after a bowel movement if you are female. Use each tissue one time when you wipe. Contact a health care provider if:  You have back pain.  You have a fever.  You feel nauseous or vomit.  Your symptoms do not get better after 3 days.  Your symptoms go away and then return. Get help right away  if:  You have severe back pain or lower abdominal pain.  You are vomiting and cannot keep down any medicines or water. This information is not intended to replace advice given to you by your health care provider. Make sure you discuss any questions you have with your health care provider. Document Released: 06/18/2005 Document Revised: 02/20/2016 Document Reviewed: 07/30/2015 Elsevier Interactive Patient Education  [employer].

## 2018-06-10 DIAGNOSIS — F341 Dysthymic disorder: Secondary | ICD-10-CM | POA: Diagnosis not present

## 2018-06-22 DIAGNOSIS — F341 Dysthymic disorder: Secondary | ICD-10-CM | POA: Diagnosis not present

## 2018-07-06 DIAGNOSIS — F341 Dysthymic disorder: Secondary | ICD-10-CM | POA: Diagnosis not present

## 2018-07-19 DIAGNOSIS — F341 Dysthymic disorder: Secondary | ICD-10-CM | POA: Diagnosis not present

## 2018-07-21 ENCOUNTER — Encounter (HOSPITAL_COMMUNITY): Payer: Self-pay | Admitting: Emergency Medicine

## 2018-07-21 ENCOUNTER — Emergency Department (HOSPITAL_COMMUNITY)
Admission: EM | Admit: 2018-07-21 | Discharge: 2018-07-21 | Disposition: A | Discharge status: Home / Self Care | Payer: BLUE CROSS/BLUE SHIELD | Attending: Emergency Medicine | Admitting: Emergency Medicine

## 2018-07-21 ENCOUNTER — Ambulatory Visit (HOSPITAL_COMMUNITY): Admission: EM | Admit: 2018-07-21 | Discharge: 2018-07-21 | Discharge status: Absent without leave | Payer: Self-pay

## 2018-07-21 ENCOUNTER — Other Ambulatory Visit: Payer: Self-pay

## 2018-07-21 DIAGNOSIS — Z79899 Other long term (current) drug therapy: Secondary | ICD-10-CM | POA: Insufficient documentation

## 2018-07-21 DIAGNOSIS — R55 Syncope and collapse: Secondary | ICD-10-CM | POA: Diagnosis not present

## 2018-07-21 HISTORY — DX: Depression, unspecified: F32.A

## 2018-07-21 HISTORY — DX: Major depressive disorder, single episode, unspecified: F32.9

## 2018-07-21 LAB — CBC
HCT: 42.4 % (ref 36.0–46.0)
Hemoglobin: 12.5 g/dL (ref 12.0–15.0)
MCH: 25 pg — ABNORMAL LOW (ref 26.0–34.0)
MCHC: 29.5 g/dL — AB (ref 30.0–36.0)
MCV: 84.6 fL (ref 80.0–100.0)
NRBC: 0 % (ref 0.0–0.2)
PLATELETS: 319 10*3/uL (ref 150–400)
RBC: 5.01 MIL/uL (ref 3.87–5.11)
RDW: 14.3 % (ref 11.5–15.5)
WBC: 11.7 10*3/uL — AB (ref 4.0–10.5)

## 2018-07-21 LAB — BASIC METABOLIC PANEL
Anion gap: 10 (ref 5–15)
BUN: 6 mg/dL (ref 6–20)
CO2: 24 mmol/L (ref 22–32)
CREATININE: 0.73 mg/dL (ref 0.44–1.00)
Calcium: 9.5 mg/dL (ref 8.9–10.3)
Chloride: 103 mmol/L (ref 98–111)
Glucose, Bld: 91 mg/dL (ref 70–99)
Potassium: 4.3 mmol/L (ref 3.5–5.1)
SODIUM: 137 mmol/L (ref 135–145)

## 2018-07-21 LAB — URINALYSIS, ROUTINE W REFLEX MICROSCOPIC
BILIRUBIN URINE: NEGATIVE
Glucose, UA: NEGATIVE mg/dL
HGB URINE DIPSTICK: NEGATIVE
Ketones, ur: NEGATIVE mg/dL
LEUKOCYTES UA: NEGATIVE
Nitrite: NEGATIVE
PH: 6 (ref 5.0–8.0)
Protein, ur: NEGATIVE mg/dL
Specific Gravity, Urine: 1.006 (ref 1.005–1.030)

## 2018-07-21 LAB — I-STAT BETA HCG BLOOD, ED (MC, WL, AP ONLY)

## 2018-07-21 LAB — CBG MONITORING, ED: Glucose-Capillary: 82 mg/dL (ref 70–99)

## 2018-07-21 MED ORDER — LACTATED RINGERS IV BOLUS
1000.0000 mL | Freq: Once | INTRAVENOUS | Status: AC
  Administered 2018-07-21: 1000 mL via INTRAVENOUS

## 2018-07-21 NOTE — ED Triage Notes (Signed)
Pt reports that she has been having cold symptoms x 1 week and that today while she was at work she had a syncopal episode while bending over to lift a tote.

## 2018-07-21 NOTE — ED Provider Notes (Signed)
[facility] EMERGENCY DEPARTMENT Provider Note   CSN: [id number] Arrival date & time: 07/21/18  1650     History   Chief Complaint Chief Complaint  Patient presents with  . Loss of Consciousness    HPI Jillian [name] is a 32 y.o. female.  The history is provided by the patient.  Near Syncope  This is a new problem. The current episode started 3 to 5 hours ago. The problem has been resolved. Pertinent negatives include no chest pain, no abdominal pain, no headaches and no shortness of breath. Nothing aggravates the symptoms. Nothing relieves the symptoms. She has tried water and rest for the symptoms. The treatment provided significant relief.    Past Medical History:  Diagnosis Date  . Depression     There are no active problems to display for this patient.   History reviewed. No pertinent surgical history.   OB History   None      Home Medications    Prior to Admission medications   Medication Sig Start Date End Date Taking? Authorizing Provider  ARIPiprazole (ABILIFY) 10 MG tablet Take 10 mg by mouth daily.    [provider]  FLUoxetine (PROZAC) 20 MG capsule Take 20 mg by mouth daily.    [provider]    Family History No family history on file.  Social History Social History   Tobacco Use  . Smoking status: Never Smoker  . Smokeless tobacco: Never Used  Substance Use Topics  . Alcohol use: Yes    Comment: Social  . Drug use: Yes    Types: Marijuana    Comment: daily     Allergies   Ibuprofen   Review of Systems Review of Systems  Constitutional: Negative for chills and fever.  HENT: Negative for ear pain and sore throat.   Eyes: Negative for pain and visual disturbance.  Respiratory: Negative for cough and shortness of breath.   Cardiovascular: Positive for near-syncope. Negative for chest pain and palpitations.  Gastrointestinal: Negative for abdominal pain and vomiting.  Genitourinary: Negative for  dysuria and hematuria.  Musculoskeletal: Negative for arthralgias and back pain.  Skin: Negative for color change and rash.  Neurological: Positive for light-headedness. Negative for tremors, seizures, syncope, weakness and headaches.  All other systems reviewed and are negative.    Physical Exam Updated Vital Signs BP 120/68   Pulse 84   Temp 99.2 F (37.3 C)   Resp 20   Ht 5\' 6"  (1.676 m)   Wt 124.7 kg   LMP  (Within Weeks)   SpO2 98%   BMI 44.39 kg/m   Physical Exam  Constitutional: She is oriented to person, place, and time. She appears well-developed and well-nourished. No distress.  HENT:  Head: Normocephalic and atraumatic.  Eyes: Pupils are equal, round, and reactive to light. Conjunctivae and EOM are normal.  Neck: Normal range of motion. Neck supple.  Cardiovascular: Normal rate, regular rhythm, normal heart sounds and intact distal pulses.  No murmur heard. Pulmonary/Chest: Effort normal and breath sounds normal. No respiratory distress.  Abdominal: Soft. There is no tenderness.  Musculoskeletal: Normal range of motion. She exhibits no edema.  Neurological: She is alert and oriented to person, place, and time. No cranial nerve deficit or sensory deficit. She exhibits normal muscle tone. Coordination normal.  Skin: Skin is warm and dry. Capillary refill takes less than 2 seconds.  Psychiatric: She has a normal mood and affect.  Nursing note and vitals reviewed.  ED Treatments / Results  Labs (all labs ordered are listed, but only abnormal results are displayed) Labs Reviewed  CBC - Abnormal; Notable for the following components:      Result Value   WBC 11.7 (*)    MCH 25.0 (*)    MCHC 29.5 (*)    All other components within normal limits  BASIC METABOLIC PANEL  URINALYSIS, ROUTINE W REFLEX MICROSCOPIC  CBG MONITORING, ED  I-STAT BETA HCG BLOOD, ED ([facility], [facility], [facility] ONLY)    EKG EKG Interpretation  Date/Time:  Wednesday July 21 2018 17:34:28  EDT Ventricular Rate:  98 PR Interval:  134 QRS Duration: 82 QT Interval:  340 QTC Calculation: 434 R Axis:   51 Text Interpretation:  Normal sinus rhythm Right atrial enlargement Borderline ECG Confirmed by Virgina Norfolk [phone]) on 07/21/2018 6:23:03 PM   Radiology No results found.  Procedures Procedures (including critical care time)  Medications Ordered in ED Medications  lactated ringers bolus 1,000 mL (1,000 mLs Intravenous New Bag/Given 07/21/18 1832)     Initial Impression / Assessment and Plan / ED Course  I have reviewed the triage vital signs and the nursing notes.  Pertinent labs & imaging results that were available during my care of the patient were reviewed by me and considered in my medical decision making (see chart for details).     Jillian [name] is a 32 year old female with no significant medical history who presents to the ED after syncopal event.  Patient with normal vitals.  No fever.  Patient states that she was working lifting boxes and she felt warm, flushed, lightheaded and fell to the floor.  She did not hit her head.  No loss of consciousness.  No seizure-like activity.  No postictal state.  Patient states that she has felt flulike for the last several days and has felt dehydrated.  She is alert and oriented.  Normal neurological exam.  She does not have any chest pain, shortness of breath.  She has no PE or DVT risk factors.  EKG shows sinus rhythm.  Patient overall well-appearing.  No significant anemia, electrolyte abnormality, kidney injury.  Pregnancy test negative.Urinalysis negative.  Patient feels better after IV fluids.  Suspect patient likely with vasovagal event versus orthostatic event.  History and physical is not consistent with an arrhythmia and EKG is reassuring.  No significant family history.  Patient discharged from ED in good condition and given return precautions and recommend follow-up with primary care doctor.  This chart was dictated  using voice recognition software.  Despite best efforts to proofread,  errors can occur which can change the documentation meaning.   Final Clinical Impressions(s) / ED Diagnoses   Final diagnoses:  Vasovagal syncope    ED Discharge Orders    None       Virgina Norfolk, DO 07/21/18 2000

## 2018-07-28 DIAGNOSIS — J0140 Acute pansinusitis, unspecified: Secondary | ICD-10-CM | POA: Diagnosis not present

## 2018-07-29 DIAGNOSIS — F341 Dysthymic disorder: Secondary | ICD-10-CM | POA: Diagnosis not present

## 2018-08-03 DIAGNOSIS — F341 Dysthymic disorder: Secondary | ICD-10-CM | POA: Diagnosis not present

## 2018-09-02 DIAGNOSIS — J029 Acute pharyngitis, unspecified: Secondary | ICD-10-CM | POA: Diagnosis not present

## 2018-09-06 DIAGNOSIS — F341 Dysthymic disorder: Secondary | ICD-10-CM | POA: Diagnosis not present

## 2018-10-05 DIAGNOSIS — F341 Dysthymic disorder: Secondary | ICD-10-CM | POA: Diagnosis not present

## 2018-10-19 DIAGNOSIS — F341 Dysthymic disorder: Secondary | ICD-10-CM | POA: Diagnosis not present

## 2018-11-02 DIAGNOSIS — F341 Dysthymic disorder: Secondary | ICD-10-CM | POA: Diagnosis not present

## 2018-11-30 DIAGNOSIS — F341 Dysthymic disorder: Secondary | ICD-10-CM | POA: Diagnosis not present

## 2018-12-14 DIAGNOSIS — F341 Dysthymic disorder: Secondary | ICD-10-CM | POA: Diagnosis not present

## 2018-12-28 DIAGNOSIS — F341 Dysthymic disorder: Secondary | ICD-10-CM | POA: Diagnosis not present

## 2019-01-11 DIAGNOSIS — F341 Dysthymic disorder: Secondary | ICD-10-CM | POA: Diagnosis not present

## 2019-01-21 DIAGNOSIS — N3000 Acute cystitis without hematuria: Secondary | ICD-10-CM | POA: Diagnosis not present

## 2019-01-24 DIAGNOSIS — F341 Dysthymic disorder: Secondary | ICD-10-CM | POA: Diagnosis not present

## 2019-03-22 DIAGNOSIS — N390 Urinary tract infection, site not specified: Secondary | ICD-10-CM | POA: Diagnosis not present

## 2019-04-11 ENCOUNTER — Ambulatory Visit (INDEPENDENT_AMBULATORY_CARE_PROVIDER_SITE_OTHER): Payer: BC Managed Care – PPO | Admitting: Family Medicine

## 2019-04-11 ENCOUNTER — Encounter: Payer: Self-pay | Admitting: Family Medicine

## 2019-04-11 ENCOUNTER — Other Ambulatory Visit: Payer: Self-pay

## 2019-04-11 DIAGNOSIS — F32A Depression, unspecified: Secondary | ICD-10-CM

## 2019-04-11 DIAGNOSIS — Z7689 Persons encountering health services in other specified circumstances: Secondary | ICD-10-CM

## 2019-04-11 DIAGNOSIS — F329 Major depressive disorder, single episode, unspecified: Secondary | ICD-10-CM

## 2019-04-11 DIAGNOSIS — K219 Gastro-esophageal reflux disease without esophagitis: Secondary | ICD-10-CM

## 2019-04-11 DIAGNOSIS — R1111 Vomiting without nausea: Secondary | ICD-10-CM

## 2019-04-11 DIAGNOSIS — F419 Anxiety disorder, unspecified: Secondary | ICD-10-CM | POA: Diagnosis not present

## 2019-04-11 MED ORDER — PANTOPRAZOLE SODIUM 20 MG PO TBEC: 20.0000 mg | DELAYED_RELEASE_TABLET | Freq: Every day | ORAL | 3 refills | Status: DC

## 2019-04-11 NOTE — Progress Notes (Signed)
Virtual Visit via Video Note  I connected with Jillian [name] (pronounced "Tee-ra") on 04/11/19 at  1:00 PM EDT by a video enabled telemedicine application and verified that I am speaking with the correct person using two identifiers.  Location patient: home Location provider:work or home office Persons participating in the virtual visit: patient, provider  I discussed the limitations of evaluation and management by telemedicine and the availability of in person appointments. The patient expressed understanding and agreed to proceed.   HPI: Pt is a 33 yo female with pmh sig depression and anxiety.  Pt has not had a pcp in a while.  Depression and anxiety: -pt is seen by [facility]. -Dr. Harvie Heckandy Reedling at [facility] prescribes her medications.   -on fluoxetine and abilify -also taking trazadone for sleep -energy is low b/c not doing much -states mood and sleep have been good despite quarantine -denies SI/HI -still dealing with a break up from March.  Emesis: -everyday since January 2020 -first thing in am after moving around. -has dinner at 7 or 8 pm -states vomits phlegm and bile -denies HAs, dizziness, constipation,  -may have  -pt has acid reflux with spicy foods.  Takes Tums -may have a few episodes of loose stools per wk.  Allergies: Ibuprofen-throat edema.  Pt notes Aleeve does not cause issues.  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  Denies tobacco use. Endorses EtOH use.  States has cut down as was drinking 1/2 a bottle of wine per day.  Pt endorses weekly marijuana use   Family Medical hx: unknown as pt is adopted.  ROS: See pertinent positives and negatives per HPI.  Past Medical History:  Diagnosis Date  . Depression     No past surgical history on file.  No family history on file.   Current Outpatient Medications:  .  ARIPiprazole (ABILIFY) 10 MG tablet, Take 10 mg by  mouth daily., Disp: , Rfl:  .  FLUoxetine (PROZAC) 20 MG capsule, Take 20 mg by mouth daily., Disp: , Rfl:   EXAM:  VITALS per patient if applicable: RR between 12-20 bpm  GENERAL: alert, oriented, appears well and in no acute distress  HEENT: atraumatic, conjunctiva clear, no obvious abnormalities on inspection of external nose and ears  NECK: normal movements of the head and neck  LUNGS: on inspection no signs of respiratory distress, breathing rate appears normal, no obvious gross SOB, gasping or wheezing  CV: no obvious cyanosis  MS: moves all visible extremities without noticeable abnormality  PSYCH/NEURO: pleasant and cooperative, no obvious depression or anxiety, speech and thought processing grossly intact  ASSESSMENT AND PLAN:  Discussed the following assessment and plan:  Gastroesophageal reflux disease, esophagitis presence not specified -pt to keep a food journal - Plan: pantoprazole (PROTONIX) 20 MG tablet  Vomiting without nausea, intractability of vomiting not specified, unspecified vomiting type  -concern GERD vs (Canabis hyperemesis syndrome) MJ use, vs other cause contributing to symptoms. -discussed managing GERD.  Will start protonix qhs -pt advised to d/c MJ use. -will re-evaluate in 1 month -for continued symptoms refer to GI.  Encounter to establish care  -We reviewed the PMH, PSH, FH, SH, Meds and Allergies. -We provided refills for any medications we will prescribe as needed. -We addressed current concerns per orders and patient instructions. -We have asked for records for pertinent exams, studies, vaccines and notes from previous providers. -We have advised patient to  follow up per instructions below.  Anxiety and depression  -stable -continue current meds: prozac, abilify, and trazodone as prescribed by Psych -continue f/u with Psych and counselor.  F/u in 1 month   I discussed the assessment and treatment plan with the patient. The patient  was provided an opportunity to ask questions and all were answered. The patient agreed with the plan and demonstrated an understanding of the instructions.   The patient was advised to call back or seek an in-person evaluation if the symptoms worsen or if the condition fails to improve as anticipated.   Billie Ruddy, MD

## 2019-06-20 DIAGNOSIS — R300 Dysuria: Secondary | ICD-10-CM | POA: Diagnosis not present

## 2019-06-28 DIAGNOSIS — N390 Urinary tract infection, site not specified: Secondary | ICD-10-CM | POA: Diagnosis not present

## 2019-07-03 ENCOUNTER — Other Ambulatory Visit: Payer: Self-pay | Admitting: Family Medicine

## 2019-07-03 DIAGNOSIS — K219 Gastro-esophageal reflux disease without esophagitis: Secondary | ICD-10-CM

## 2019-07-14 DIAGNOSIS — F341 Dysthymic disorder: Secondary | ICD-10-CM | POA: Diagnosis not present

## 2019-07-17 DIAGNOSIS — R300 Dysuria: Secondary | ICD-10-CM | POA: Diagnosis not present

## 2019-07-21 DIAGNOSIS — F341 Dysthymic disorder: Secondary | ICD-10-CM | POA: Diagnosis not present

## 2019-07-28 DIAGNOSIS — F341 Dysthymic disorder: Secondary | ICD-10-CM | POA: Diagnosis not present

## 2019-08-04 DIAGNOSIS — F341 Dysthymic disorder: Secondary | ICD-10-CM | POA: Diagnosis not present

## 2019-08-09 DIAGNOSIS — F329 Major depressive disorder, single episode, unspecified: Secondary | ICD-10-CM | POA: Diagnosis not present

## 2019-08-11 DIAGNOSIS — F341 Dysthymic disorder: Secondary | ICD-10-CM | POA: Diagnosis not present

## 2019-08-25 DIAGNOSIS — F341 Dysthymic disorder: Secondary | ICD-10-CM | POA: Diagnosis not present

## 2019-09-06 DIAGNOSIS — F329 Major depressive disorder, single episode, unspecified: Secondary | ICD-10-CM | POA: Diagnosis not present

## 2019-09-08 DIAGNOSIS — F341 Dysthymic disorder: Secondary | ICD-10-CM | POA: Diagnosis not present

## 2019-09-29 DIAGNOSIS — F341 Dysthymic disorder: Secondary | ICD-10-CM | POA: Diagnosis not present

## 2019-10-04 DIAGNOSIS — F329 Major depressive disorder, single episode, unspecified: Secondary | ICD-10-CM | POA: Diagnosis not present

## 2019-10-13 DIAGNOSIS — F341 Dysthymic disorder: Secondary | ICD-10-CM | POA: Diagnosis not present

## 2019-10-27 DIAGNOSIS — F341 Dysthymic disorder: Secondary | ICD-10-CM | POA: Diagnosis not present

## 2019-11-04 DIAGNOSIS — F341 Dysthymic disorder: Secondary | ICD-10-CM | POA: Diagnosis not present

## 2019-11-08 DIAGNOSIS — F329 Major depressive disorder, single episode, unspecified: Secondary | ICD-10-CM | POA: Diagnosis not present

## 2019-11-18 DIAGNOSIS — F341 Dysthymic disorder: Secondary | ICD-10-CM | POA: Diagnosis not present

## 2019-12-02 DIAGNOSIS — F341 Dysthymic disorder: Secondary | ICD-10-CM | POA: Diagnosis not present

## 2019-12-09 DIAGNOSIS — F341 Dysthymic disorder: Secondary | ICD-10-CM | POA: Diagnosis not present

## 2019-12-16 DIAGNOSIS — F341 Dysthymic disorder: Secondary | ICD-10-CM | POA: Diagnosis not present

## 2019-12-22 ENCOUNTER — Other Ambulatory Visit: Payer: Self-pay | Admitting: Family Medicine

## 2019-12-22 DIAGNOSIS — K219 Gastro-esophageal reflux disease without esophagitis: Secondary | ICD-10-CM

## 2019-12-30 DIAGNOSIS — F341 Dysthymic disorder: Secondary | ICD-10-CM | POA: Diagnosis not present

## 2020-01-13 DIAGNOSIS — F341 Dysthymic disorder: Secondary | ICD-10-CM | POA: Diagnosis not present

## 2020-01-17 DIAGNOSIS — F329 Major depressive disorder, single episode, unspecified: Secondary | ICD-10-CM | POA: Diagnosis not present

## 2020-01-20 DIAGNOSIS — F341 Dysthymic disorder: Secondary | ICD-10-CM | POA: Diagnosis not present

## 2020-01-26 DIAGNOSIS — F341 Dysthymic disorder: Secondary | ICD-10-CM | POA: Diagnosis not present

## 2020-02-10 DIAGNOSIS — F341 Dysthymic disorder: Secondary | ICD-10-CM | POA: Diagnosis not present

## 2020-02-21 DIAGNOSIS — F329 Major depressive disorder, single episode, unspecified: Secondary | ICD-10-CM | POA: Diagnosis not present

## 2020-02-22 DIAGNOSIS — F341 Dysthymic disorder: Secondary | ICD-10-CM | POA: Diagnosis not present

## 2020-03-02 DIAGNOSIS — F341 Dysthymic disorder: Secondary | ICD-10-CM | POA: Diagnosis not present

## 2020-03-09 DIAGNOSIS — F341 Dysthymic disorder: Secondary | ICD-10-CM | POA: Diagnosis not present

## 2020-03-23 DIAGNOSIS — F341 Dysthymic disorder: Secondary | ICD-10-CM | POA: Diagnosis not present

## 2020-03-29 DIAGNOSIS — F329 Major depressive disorder, single episode, unspecified: Secondary | ICD-10-CM | POA: Diagnosis not present

## 2020-04-06 DIAGNOSIS — F341 Dysthymic disorder: Secondary | ICD-10-CM | POA: Diagnosis not present

## 2020-04-12 DIAGNOSIS — F341 Dysthymic disorder: Secondary | ICD-10-CM | POA: Diagnosis not present

## 2020-04-20 DIAGNOSIS — F341 Dysthymic disorder: Secondary | ICD-10-CM | POA: Diagnosis not present

## 2020-05-07 DIAGNOSIS — F341 Dysthymic disorder: Secondary | ICD-10-CM | POA: Diagnosis not present

## 2020-05-10 DIAGNOSIS — F341 Dysthymic disorder: Secondary | ICD-10-CM | POA: Diagnosis not present

## 2020-05-10 DIAGNOSIS — F329 Major depressive disorder, single episode, unspecified: Secondary | ICD-10-CM | POA: Diagnosis not present

## 2020-05-14 DIAGNOSIS — F341 Dysthymic disorder: Secondary | ICD-10-CM | POA: Diagnosis not present

## 2020-05-17 DIAGNOSIS — F341 Dysthymic disorder: Secondary | ICD-10-CM | POA: Diagnosis not present

## 2020-05-24 DIAGNOSIS — F329 Major depressive disorder, single episode, unspecified: Secondary | ICD-10-CM | POA: Diagnosis not present

## 2020-05-24 DIAGNOSIS — F341 Dysthymic disorder: Secondary | ICD-10-CM | POA: Diagnosis not present

## 2020-06-01 DIAGNOSIS — F341 Dysthymic disorder: Secondary | ICD-10-CM | POA: Diagnosis not present

## 2020-06-07 DIAGNOSIS — F329 Major depressive disorder, single episode, unspecified: Secondary | ICD-10-CM | POA: Diagnosis not present

## 2020-06-08 DIAGNOSIS — F341 Dysthymic disorder: Secondary | ICD-10-CM | POA: Diagnosis not present

## 2020-06-14 DIAGNOSIS — F341 Dysthymic disorder: Secondary | ICD-10-CM | POA: Diagnosis not present

## 2020-06-20 DIAGNOSIS — F341 Dysthymic disorder: Secondary | ICD-10-CM | POA: Diagnosis not present

## 2020-06-21 DIAGNOSIS — F329 Major depressive disorder, single episode, unspecified: Secondary | ICD-10-CM | POA: Diagnosis not present

## 2020-06-26 ENCOUNTER — Other Ambulatory Visit: Payer: Self-pay | Admitting: Family Medicine

## 2020-06-26 DIAGNOSIS — K219 Gastro-esophageal reflux disease without esophagitis: Secondary | ICD-10-CM

## 2020-06-26 NOTE — Telephone Encounter (Signed)
Pt needs appointment for further refills 

## 2020-06-28 DIAGNOSIS — F341 Dysthymic disorder: Secondary | ICD-10-CM | POA: Diagnosis not present

## 2020-07-05 DIAGNOSIS — F329 Major depressive disorder, single episode, unspecified: Secondary | ICD-10-CM | POA: Diagnosis not present

## 2020-07-06 DIAGNOSIS — F341 Dysthymic disorder: Secondary | ICD-10-CM | POA: Diagnosis not present

## 2020-07-13 DIAGNOSIS — F341 Dysthymic disorder: Secondary | ICD-10-CM | POA: Diagnosis not present

## 2020-07-17 DIAGNOSIS — F341 Dysthymic disorder: Secondary | ICD-10-CM | POA: Diagnosis not present

## 2020-07-19 DIAGNOSIS — F329 Major depressive disorder, single episode, unspecified: Secondary | ICD-10-CM | POA: Diagnosis not present

## 2020-07-20 DIAGNOSIS — F341 Dysthymic disorder: Secondary | ICD-10-CM | POA: Diagnosis not present

## 2020-07-25 DIAGNOSIS — F341 Dysthymic disorder: Secondary | ICD-10-CM | POA: Diagnosis not present

## 2020-07-26 DIAGNOSIS — F341 Dysthymic disorder: Secondary | ICD-10-CM | POA: Diagnosis not present

## 2020-07-30 DIAGNOSIS — F331 Major depressive disorder, recurrent, moderate: Secondary | ICD-10-CM | POA: Diagnosis not present

## 2020-07-31 DIAGNOSIS — F331 Major depressive disorder, recurrent, moderate: Secondary | ICD-10-CM | POA: Diagnosis not present

## 2020-08-01 DIAGNOSIS — F331 Major depressive disorder, recurrent, moderate: Secondary | ICD-10-CM | POA: Diagnosis not present

## 2020-08-02 DIAGNOSIS — F331 Major depressive disorder, recurrent, moderate: Secondary | ICD-10-CM | POA: Diagnosis not present

## 2020-08-03 DIAGNOSIS — F331 Major depressive disorder, recurrent, moderate: Secondary | ICD-10-CM | POA: Diagnosis not present

## 2020-08-04 DIAGNOSIS — F329 Major depressive disorder, single episode, unspecified: Secondary | ICD-10-CM | POA: Diagnosis not present

## 2020-08-06 DIAGNOSIS — F331 Major depressive disorder, recurrent, moderate: Secondary | ICD-10-CM | POA: Diagnosis not present

## 2020-08-07 DIAGNOSIS — F331 Major depressive disorder, recurrent, moderate: Secondary | ICD-10-CM | POA: Diagnosis not present

## 2020-08-08 DIAGNOSIS — F331 Major depressive disorder, recurrent, moderate: Secondary | ICD-10-CM | POA: Diagnosis not present

## 2020-08-09 DIAGNOSIS — F331 Major depressive disorder, recurrent, moderate: Secondary | ICD-10-CM | POA: Diagnosis not present

## 2020-08-10 DIAGNOSIS — F331 Major depressive disorder, recurrent, moderate: Secondary | ICD-10-CM | POA: Diagnosis not present

## 2020-08-11 DIAGNOSIS — F329 Major depressive disorder, single episode, unspecified: Secondary | ICD-10-CM | POA: Diagnosis not present

## 2020-08-13 DIAGNOSIS — F331 Major depressive disorder, recurrent, moderate: Secondary | ICD-10-CM | POA: Diagnosis not present

## 2020-08-14 DIAGNOSIS — F331 Major depressive disorder, recurrent, moderate: Secondary | ICD-10-CM | POA: Diagnosis not present

## 2020-08-15 DIAGNOSIS — F331 Major depressive disorder, recurrent, moderate: Secondary | ICD-10-CM | POA: Diagnosis not present

## 2020-08-17 DIAGNOSIS — F331 Major depressive disorder, recurrent, moderate: Secondary | ICD-10-CM | POA: Diagnosis not present

## 2020-08-20 DIAGNOSIS — F331 Major depressive disorder, recurrent, moderate: Secondary | ICD-10-CM | POA: Diagnosis not present

## 2020-08-21 DIAGNOSIS — F331 Major depressive disorder, recurrent, moderate: Secondary | ICD-10-CM | POA: Diagnosis not present

## 2020-08-22 DIAGNOSIS — F331 Major depressive disorder, recurrent, moderate: Secondary | ICD-10-CM | POA: Diagnosis not present

## 2020-08-23 DIAGNOSIS — F331 Major depressive disorder, recurrent, moderate: Secondary | ICD-10-CM | POA: Diagnosis not present

## 2020-08-24 DIAGNOSIS — F331 Major depressive disorder, recurrent, moderate: Secondary | ICD-10-CM | POA: Diagnosis not present

## 2020-08-27 DIAGNOSIS — F331 Major depressive disorder, recurrent, moderate: Secondary | ICD-10-CM | POA: Diagnosis not present

## 2020-08-28 DIAGNOSIS — F331 Major depressive disorder, recurrent, moderate: Secondary | ICD-10-CM | POA: Diagnosis not present

## 2020-08-30 DIAGNOSIS — F331 Major depressive disorder, recurrent, moderate: Secondary | ICD-10-CM | POA: Diagnosis not present

## 2020-09-01 DIAGNOSIS — F341 Dysthymic disorder: Secondary | ICD-10-CM | POA: Diagnosis not present

## 2020-09-03 DIAGNOSIS — F331 Major depressive disorder, recurrent, moderate: Secondary | ICD-10-CM | POA: Diagnosis not present

## 2020-09-04 DIAGNOSIS — F331 Major depressive disorder, recurrent, moderate: Secondary | ICD-10-CM | POA: Diagnosis not present

## 2020-09-06 DIAGNOSIS — F331 Major depressive disorder, recurrent, moderate: Secondary | ICD-10-CM | POA: Diagnosis not present

## 2020-09-10 DIAGNOSIS — F331 Major depressive disorder, recurrent, moderate: Secondary | ICD-10-CM | POA: Diagnosis not present

## 2020-09-11 DIAGNOSIS — F331 Major depressive disorder, recurrent, moderate: Secondary | ICD-10-CM | POA: Diagnosis not present

## 2020-09-12 DIAGNOSIS — F331 Major depressive disorder, recurrent, moderate: Secondary | ICD-10-CM | POA: Diagnosis not present

## 2020-09-13 DIAGNOSIS — F331 Major depressive disorder, recurrent, moderate: Secondary | ICD-10-CM | POA: Diagnosis not present

## 2020-09-17 DIAGNOSIS — F331 Major depressive disorder, recurrent, moderate: Secondary | ICD-10-CM | POA: Diagnosis not present

## 2020-09-18 DIAGNOSIS — F341 Dysthymic disorder: Secondary | ICD-10-CM | POA: Diagnosis not present

## 2020-09-21 ENCOUNTER — Other Ambulatory Visit: Payer: Self-pay | Admitting: Family Medicine

## 2020-09-21 DIAGNOSIS — K219 Gastro-esophageal reflux disease without esophagitis: Secondary | ICD-10-CM

## 2020-09-27 DIAGNOSIS — F329 Major depressive disorder, single episode, unspecified: Secondary | ICD-10-CM | POA: Diagnosis not present

## 2020-10-03 DIAGNOSIS — F341 Dysthymic disorder: Secondary | ICD-10-CM | POA: Diagnosis not present

## 2020-10-17 DIAGNOSIS — F341 Dysthymic disorder: Secondary | ICD-10-CM | POA: Diagnosis not present

## 2020-10-18 DIAGNOSIS — F329 Major depressive disorder, single episode, unspecified: Secondary | ICD-10-CM | POA: Diagnosis not present

## 2020-10-23 ENCOUNTER — Other Ambulatory Visit: Payer: Self-pay | Admitting: Family Medicine

## 2020-10-23 DIAGNOSIS — K219 Gastro-esophageal reflux disease without esophagitis: Secondary | ICD-10-CM

## 2020-10-24 DIAGNOSIS — F341 Dysthymic disorder: Secondary | ICD-10-CM | POA: Diagnosis not present

## 2020-10-24 NOTE — Telephone Encounter (Signed)
Pt needs appointment for further refills 

## 2020-10-30 DIAGNOSIS — F329 Major depressive disorder, single episode, unspecified: Secondary | ICD-10-CM | POA: Diagnosis not present

## 2020-11-07 DIAGNOSIS — F341 Dysthymic disorder: Secondary | ICD-10-CM | POA: Diagnosis not present

## 2020-11-13 DIAGNOSIS — F341 Dysthymic disorder: Secondary | ICD-10-CM | POA: Diagnosis not present

## 2020-11-20 ENCOUNTER — Other Ambulatory Visit: Payer: Self-pay | Admitting: Family Medicine

## 2020-11-20 DIAGNOSIS — K219 Gastro-esophageal reflux disease without esophagitis: Secondary | ICD-10-CM

## 2020-11-22 DIAGNOSIS — F329 Major depressive disorder, single episode, unspecified: Secondary | ICD-10-CM | POA: Diagnosis not present

## 2020-11-30 ENCOUNTER — Other Ambulatory Visit: Payer: Self-pay | Admitting: Family Medicine

## 2020-11-30 DIAGNOSIS — K219 Gastro-esophageal reflux disease without esophagitis: Secondary | ICD-10-CM

## 2020-12-06 DIAGNOSIS — F329 Major depressive disorder, single episode, unspecified: Secondary | ICD-10-CM | POA: Diagnosis not present

## 2020-12-11 DIAGNOSIS — F341 Dysthymic disorder: Secondary | ICD-10-CM | POA: Diagnosis not present

## 2020-12-25 DIAGNOSIS — F341 Dysthymic disorder: Secondary | ICD-10-CM | POA: Diagnosis not present

## 2021-01-03 DIAGNOSIS — F329 Major depressive disorder, single episode, unspecified: Secondary | ICD-10-CM | POA: Diagnosis not present

## 2021-01-08 DIAGNOSIS — R300 Dysuria: Secondary | ICD-10-CM | POA: Diagnosis not present

## 2021-01-08 DIAGNOSIS — R350 Frequency of micturition: Secondary | ICD-10-CM | POA: Diagnosis not present

## 2021-01-19 DIAGNOSIS — F341 Dysthymic disorder: Secondary | ICD-10-CM | POA: Diagnosis not present

## 2021-01-26 DIAGNOSIS — F341 Dysthymic disorder: Secondary | ICD-10-CM | POA: Diagnosis not present

## 2021-02-01 ENCOUNTER — Telehealth (HOSPITAL_BASED_OUTPATIENT_CLINIC_OR_DEPARTMENT_OTHER): Payer: Self-pay

## 2021-02-01 NOTE — Telephone Encounter (Signed)
LVM - MyChart scheduling issue - Please reschedule

## 2021-02-07 DIAGNOSIS — F329 Major depressive disorder, single episode, unspecified: Secondary | ICD-10-CM | POA: Diagnosis not present

## 2021-02-08 ENCOUNTER — Ambulatory Visit (HOSPITAL_BASED_OUTPATIENT_CLINIC_OR_DEPARTMENT_OTHER): Payer: BC Managed Care – PPO | Admitting: Nurse Practitioner

## 2021-02-09 DIAGNOSIS — F341 Dysthymic disorder: Secondary | ICD-10-CM | POA: Diagnosis not present

## 2021-02-16 DIAGNOSIS — F341 Dysthymic disorder: Secondary | ICD-10-CM | POA: Diagnosis not present

## 2021-03-07 DIAGNOSIS — F329 Major depressive disorder, single episode, unspecified: Secondary | ICD-10-CM | POA: Diagnosis not present

## 2021-03-09 DIAGNOSIS — F341 Dysthymic disorder: Secondary | ICD-10-CM | POA: Diagnosis not present

## 2021-04-04 DIAGNOSIS — F329 Major depressive disorder, single episode, unspecified: Secondary | ICD-10-CM | POA: Diagnosis not present

## 2021-04-27 DIAGNOSIS — F341 Dysthymic disorder: Secondary | ICD-10-CM | POA: Diagnosis not present

## 2021-05-08 DIAGNOSIS — R300 Dysuria: Secondary | ICD-10-CM | POA: Diagnosis not present

## 2021-05-09 DIAGNOSIS — F329 Major depressive disorder, single episode, unspecified: Secondary | ICD-10-CM | POA: Diagnosis not present

## 2021-05-25 DIAGNOSIS — F341 Dysthymic disorder: Secondary | ICD-10-CM | POA: Diagnosis not present

## 2021-06-13 DIAGNOSIS — F329 Major depressive disorder, single episode, unspecified: Secondary | ICD-10-CM | POA: Diagnosis not present

## 2021-06-27 DIAGNOSIS — F329 Major depressive disorder, single episode, unspecified: Secondary | ICD-10-CM | POA: Diagnosis not present

## 2021-07-04 DIAGNOSIS — F329 Major depressive disorder, single episode, unspecified: Secondary | ICD-10-CM | POA: Diagnosis not present

## 2021-07-25 DIAGNOSIS — F329 Major depressive disorder, single episode, unspecified: Secondary | ICD-10-CM | POA: Diagnosis not present

## 2021-08-01 DIAGNOSIS — F329 Major depressive disorder, single episode, unspecified: Secondary | ICD-10-CM | POA: Diagnosis not present

## 2021-08-22 DIAGNOSIS — F329 Major depressive disorder, single episode, unspecified: Secondary | ICD-10-CM | POA: Diagnosis not present

## 2021-09-11 DIAGNOSIS — Z23 Encounter for immunization: Secondary | ICD-10-CM | POA: Diagnosis not present

## 2022-04-28 ENCOUNTER — Other Ambulatory Visit (HOSPITAL_COMMUNITY): Payer: Self-pay

## 2022-04-28 MED ORDER — VRAYLAR 1.5 MG PO CAPS
ORAL_CAPSULE | ORAL | 1 refills | Status: DC
  Filled 2022-04-28: qty 30, 30d supply, fill #0
  Filled 2022-07-14: qty 30, 30d supply, fill #1
  Filled 2022-07-14: qty 14, 14d supply, fill #1

## 2022-04-29 ENCOUNTER — Other Ambulatory Visit (HOSPITAL_COMMUNITY): Payer: Self-pay

## 2022-04-30 ENCOUNTER — Other Ambulatory Visit (HOSPITAL_COMMUNITY): Payer: Self-pay

## 2022-05-02 ENCOUNTER — Other Ambulatory Visit (HOSPITAL_COMMUNITY): Payer: Self-pay

## 2022-05-02 MED ORDER — VRAYLAR 3 MG PO CAPS
ORAL_CAPSULE | ORAL | 3 refills | Status: DC
  Filled 2022-05-02: qty 30, 30d supply, fill #0
  Filled 2022-05-28 – 2022-06-11 (×2): qty 30, 30d supply, fill #1
  Filled 2022-09-02: qty 30, 30d supply, fill #2

## 2022-05-02 MED ORDER — BUPROPION HCL ER (XL) 150 MG PO TB24
ORAL_TABLET | ORAL | 11 refills | Status: DC
  Filled 2022-05-02: qty 30, 30d supply, fill #0

## 2022-05-02 MED ORDER — BUPROPION HCL ER (XL) 300 MG PO TB24
ORAL_TABLET | ORAL | 11 refills | Status: DC
  Filled 2022-05-02: qty 30, 30d supply, fill #0
  Filled 2022-06-12: qty 30, 30d supply, fill #1

## 2022-05-05 ENCOUNTER — Other Ambulatory Visit (HOSPITAL_COMMUNITY): Payer: Self-pay

## 2022-05-06 ENCOUNTER — Other Ambulatory Visit (HOSPITAL_COMMUNITY): Payer: Self-pay

## 2022-05-28 ENCOUNTER — Other Ambulatory Visit (HOSPITAL_COMMUNITY): Payer: Self-pay

## 2022-05-28 MED ORDER — GABAPENTIN 100 MG PO CAPS
100.0000 mg | ORAL_CAPSULE | Freq: Three times a day (TID) | ORAL | 5 refills | Status: DC | PRN
  Filled 2022-05-28: qty 180, 30d supply, fill #0

## 2022-05-29 ENCOUNTER — Other Ambulatory Visit (HOSPITAL_COMMUNITY): Payer: Self-pay

## 2022-06-07 ENCOUNTER — Other Ambulatory Visit (HOSPITAL_COMMUNITY): Payer: Self-pay

## 2022-06-11 ENCOUNTER — Other Ambulatory Visit (HOSPITAL_COMMUNITY): Payer: Self-pay

## 2022-06-12 ENCOUNTER — Other Ambulatory Visit (HOSPITAL_COMMUNITY): Payer: Self-pay

## 2022-07-14 ENCOUNTER — Other Ambulatory Visit (HOSPITAL_COMMUNITY): Payer: Self-pay

## 2022-07-15 ENCOUNTER — Other Ambulatory Visit (HOSPITAL_COMMUNITY): Payer: Self-pay

## 2022-07-17 ENCOUNTER — Other Ambulatory Visit (HOSPITAL_COMMUNITY): Payer: Self-pay

## 2022-09-02 ENCOUNTER — Other Ambulatory Visit (HOSPITAL_COMMUNITY): Payer: Self-pay

## 2022-10-21 ENCOUNTER — Encounter (HOSPITAL_COMMUNITY): Payer: Self-pay | Admitting: Emergency Medicine

## 2022-10-21 ENCOUNTER — Emergency Department (EMERGENCY_DEPARTMENT_HOSPITAL)
Admission: EM | Admit: 2022-10-21 | Discharge: 2022-10-22 | Disposition: A | Discharge status: Other Acute Inpatient Hospital | Payer: BC Managed Care – PPO | Source: Home / Self Care | Attending: Emergency Medicine | Admitting: Emergency Medicine

## 2022-10-21 ENCOUNTER — Other Ambulatory Visit: Payer: Self-pay

## 2022-10-21 DIAGNOSIS — T426X2A Poisoning by other antiepileptic and sedative-hypnotic drugs, intentional self-harm, initial encounter: Secondary | ICD-10-CM | POA: Insufficient documentation

## 2022-10-21 DIAGNOSIS — T1491XA Suicide attempt, initial encounter: Secondary | ICD-10-CM | POA: Diagnosis not present

## 2022-10-21 DIAGNOSIS — S61512A Laceration without foreign body of left wrist, initial encounter: Secondary | ICD-10-CM | POA: Insufficient documentation

## 2022-10-21 DIAGNOSIS — X789XXA Intentional self-harm by unspecified sharp object, initial encounter: Secondary | ICD-10-CM | POA: Insufficient documentation

## 2022-10-21 DIAGNOSIS — Z20822 Contact with and (suspected) exposure to covid-19: Secondary | ICD-10-CM | POA: Insufficient documentation

## 2022-10-21 DIAGNOSIS — F332 Major depressive disorder, recurrent severe without psychotic features: Secondary | ICD-10-CM | POA: Diagnosis not present

## 2022-10-21 DIAGNOSIS — Z23 Encounter for immunization: Secondary | ICD-10-CM | POA: Insufficient documentation

## 2022-10-21 DIAGNOSIS — F603 Borderline personality disorder: Secondary | ICD-10-CM | POA: Diagnosis present

## 2022-10-21 LAB — CBC WITH DIFFERENTIAL/PLATELET
Abs Immature Granulocytes: 0.03 10*3/uL (ref 0.00–0.07)
Basophils Absolute: 0.1 10*3/uL (ref 0.0–0.1)
Basophils Relative: 1 %
Eosinophils Absolute: 0.3 10*3/uL (ref 0.0–0.5)
Eosinophils Relative: 3 %
HCT: 44.4 % (ref 36.0–46.0)
Hemoglobin: 14.1 g/dL (ref 12.0–15.0)
Immature Granulocytes: 0 %
Lymphocytes Relative: 16 %
Lymphs Abs: 1.7 10*3/uL (ref 0.7–4.0)
MCH: 26.9 pg (ref 26.0–34.0)
MCHC: 31.8 g/dL (ref 30.0–36.0)
MCV: 84.7 fL (ref 80.0–100.0)
Monocytes Absolute: 0.6 10*3/uL (ref 0.1–1.0)
Monocytes Relative: 6 %
Neutro Abs: 7.7 10*3/uL (ref 1.7–7.7)
Neutrophils Relative %: 74 %
Platelets: 288 10*3/uL (ref 150–400)
RBC: 5.24 MIL/uL — ABNORMAL HIGH (ref 3.87–5.11)
RDW: 13.6 % (ref 11.5–15.5)
WBC: 10.4 10*3/uL (ref 4.0–10.5)
nRBC: 0 % (ref 0.0–0.2)

## 2022-10-21 LAB — COMPREHENSIVE METABOLIC PANEL
ALT: 23 U/L (ref 0–44)
AST: 18 U/L (ref 15–41)
Albumin: 4.4 g/dL (ref 3.5–5.0)
Alkaline Phosphatase: 42 U/L (ref 38–126)
Anion gap: 11 (ref 5–15)
BUN: 13 mg/dL (ref 6–20)
CO2: 25 mmol/L (ref 22–32)
Calcium: 9.3 mg/dL (ref 8.9–10.3)
Chloride: 102 mmol/L (ref 98–111)
Creatinine, Ser: 0.88 mg/dL (ref 0.44–1.00)
GFR, Estimated: >60 mL/min (ref >60)
Glucose, Bld: 88 mg/dL (ref 70–99)
Potassium: 3.9 mmol/L (ref 3.5–5.1)
Sodium: 138 mmol/L (ref 135–145)
Total Bilirubin: 0.6 mg/dL (ref 0.3–1.2)
Total Protein: 8.1 g/dL (ref 6.5–8.1)

## 2022-10-21 LAB — RESP PANEL BY RT-PCR (RSV, FLU A&B, COVID)  RVPGX2
Influenza A by PCR: NEGATIVE
Influenza B by PCR: NEGATIVE
Resp Syncytial Virus by PCR: NEGATIVE
SARS Coronavirus 2 by RT PCR: NEGATIVE

## 2022-10-21 LAB — I-STAT BETA HCG BLOOD, ED (MC, WL, AP ONLY): I-stat hCG, quantitative: <5 m[IU]/mL (ref <5)

## 2022-10-21 LAB — RAPID URINE DRUG SCREEN, HOSP PERFORMED
Amphetamines: POSITIVE — AB
Barbiturates: NOT DETECTED
Benzodiazepines: POSITIVE — AB
Cocaine: NOT DETECTED
Opiates: NOT DETECTED
Tetrahydrocannabinol: POSITIVE — AB

## 2022-10-21 LAB — TSH: TSH: 2.758 u[IU]/mL (ref 0.350–4.500)

## 2022-10-21 LAB — ETHANOL: Alcohol, Ethyl (B): <10 mg/dL (ref <10)

## 2022-10-21 LAB — SALICYLATE LEVEL: Salicylate Lvl: <7 mg/dL — ABNORMAL LOW (ref 7.0–30.0)

## 2022-10-21 LAB — ACETAMINOPHEN LEVEL: Acetaminophen (Tylenol), Serum: <10 ug/mL — ABNORMAL LOW (ref 10–30)

## 2022-10-21 MED ORDER — TETANUS-DIPHTH-ACELL PERTUSSIS 5-2.5-18.5 LF-MCG/0.5 IM SUSY
0.5000 mL | PREFILLED_SYRINGE | Freq: Once | INTRAMUSCULAR | Status: AC
  Administered 2022-10-21: 0.5 mL via INTRAMUSCULAR
  Filled 2022-10-21: qty 0.5

## 2022-10-21 MED ORDER — LIDOCAINE-EPINEPHRINE (PF) 2 %-1:200000 IJ SOLN
10.0000 mL | Freq: Once | INTRAMUSCULAR | Status: AC
  Administered 2022-10-21: 10 mL
  Filled 2022-10-21: qty 20

## 2022-10-21 NOTE — ED Notes (Signed)
Patient has been calm and cooperative this shift. She has been resting peacefully.

## 2022-10-21 NOTE — ED Triage Notes (Signed)
Patient brought in from home after crisis line called 911 for patient swallowing 28 0.5mg  ativan pills. Per EMS patient states she is suicidal due to break up with boyfriend, she also has fresh cuts to R wrist, SI started Saturday. She is A&Ox4, denies N/V/D only c/o feeling dizzy.  108 134/94 20 100% RA

## 2022-10-21 NOTE — Consult Note (Signed)
[facility] ED ASSESSMENT   Reason for Consult:  Psychiatry evaluation Referring Physician:  ER Physician Patient Identification: Jillian [name] MRN:  [id number] ED Chief Complaint: Suicide attempt [facility])  Diagnosis:  Principal Problem:   Suicide attempt [facility]) Active Problems:   Borderline personality disorder [facility])   ED Assessment Time Calculation: Start Time: 1859 Stop Time: 1922 Total Time in Minutes (Assessment Completion): 23   Subjective:   Jillian [name] is a 37 y.o. female patient admitted with previous hx of Depression, anxiety CPTSD, Borderline personality Disorder and self harm behavior brought I by EMS after OD on Ativan 28.5 MG and cutting her left wrist needing stitches.  HPI:  Female, 37 years old was sen awake, alert and oriented x 5.  She admitted suicide attempt and self harm by cutting her left wrist.  She reports her stressors as related to her long term boyfriend ending their relationship.  Patient reports she feels heart broken and still want to die.  She remains suicidal with no plan but want to die by any means.  She reports previous suicide attempts by OD up to 10 plus times.  She started as a teenager to do self harm and has a hx of multiple self harm behavior.  She reports one inpatient Psychiatry hospitalization at [place] at [facility].  Patient is employed and lives with her cat.  She denies any family support or relationship.  Patient sees DR Orpha Bur , Psychiatrist as her provider and engages in therapy weekly with [facility]. Female, 37 years old appropriately groomed attempted suicide with self harm by cutting her left wrist deeply requiring sutures.  Her stressor is a break up with her boy friend.  Patient remains suicidal with no plan.  She states she just want to die.  She meets criteria for inpatient Psychiatric hospitalization for safety and stabilization.  We will seek bed placement at any facility with available bed.  Past Psychiatric History:  previous hx of Depression, anxiety CPTSD, Borderline personality Disorder and self harm behavior.  DR Orpha Bur , Psychiatrist as her provider and engages in therapy weekly with [facility].  She reports multiple self harm by cutting starting from Teenage years.  Multiple suicide attempts by OD 10 times plus.  Risk to Self or Others: Is the patient at risk to self? Yes Has the patient been a risk to self in the past 6 months? No Has the patient been a risk to self within the distant past? Yes Is the patient a risk to others? No Has the patient been a risk to others in the past 6 months? No Has the patient been a risk to others within the distant past? No  [place] Scale:  Flowsheet Row ED from 10/21/2022 in [facility] Emergency Department at [facility]  C-SSRS RISK CATEGORY No Risk       AIMS:  , , ,  ,   ASAM:    Substance Abuse:     Past Medical History:  Past Medical History:  Diagnosis Date   Depression    No past surgical history on file. Family History:  Family History  Adopted: Yes   Family Psychiatric  History: Patient does not know.  Says she is not close to family Social History:  Social History   Substance and Sexual Activity  Alcohol Use Yes   Comment: Social     Social History   Substance and Sexual Activity  Drug Use Yes   Types: Marijuana   Comment:  daily    Social History   Socioeconomic History   Marital status: Single    Spouse name: Not on file   Number of children: Not on file   Years of education: Not on file   Highest education level: Not on file  Occupational History   Not on file  Tobacco Use   Smoking status: Never   Smokeless tobacco: Never  Substance and Sexual Activity   Alcohol use: Yes    Comment: Social   Drug use: Yes    Types: Marijuana    Comment: daily   Sexual activity: Not on file  Other Topics Concern   Not on file  Social History Narrative   Not on file   Social Determinants of Health    Financial Resource Strain: Not on file  Food Insecurity: Not on file  Transportation Needs: Not on file  Physical Activity: Not on file  Stress: Not on file  Social Connections: Not on file   Additional Social History:    Allergies:   Allergies  Allergen Reactions   Ibuprofen Swelling    Labs:  Results for orders placed or performed during the hospital encounter of 10/21/22 (from the past 48 hour(s))  Comprehensive metabolic panel     Status: None   Collection Time: 10/21/22  4:12 PM  Result Value Ref Range   Sodium 138 135 - 145 mmol/L   Potassium 3.9 3.5 - 5.1 mmol/L   Chloride 102 98 - 111 mmol/L   CO2 25 22 - 32 mmol/L   Glucose, Bld 88 70 - 99 mg/dL    Comment: Glucose reference range applies only to samples taken after fasting for at least 8 hours.   BUN 13 6 - 20 mg/dL   Creatinine, Ser 0.88 0.44 - 1.00 mg/dL   Calcium 9.3 8.9 - 10.3 mg/dL   Total Protein 8.1 6.5 - 8.1 g/dL   Albumin 4.4 3.5 - 5.0 g/dL   AST 18 15 - 41 U/L   ALT 23 0 - 44 U/L   Alkaline Phosphatase 42 38 - 126 U/L   Total Bilirubin 0.6 0.3 - 1.2 mg/dL   GFR, Estimated >60 >60 mL/min    Comment: (NOTE) Calculated using the CKD-EPI Creatinine Equation (2021)    Anion gap 11 5 - 15    Comment: Performed at [facility], [place] [address] [place], [place] [address]  Ethanol     Status: None   Collection Time: 10/21/22  4:12 PM  Result Value Ref Range   Alcohol, Ethyl (B) <10 <10 mg/dL    Comment: (NOTE) Lowest detectable limit for serum alcohol is 10 mg/dL.  For medical purposes only. Performed at [facility], [place] [address] [place], [place] [address]   CBC with Diff     Status: Abnormal   Collection Time: 10/21/22  4:12 PM  Result Value Ref Range   WBC 10.4 4.0 - 10.5 K/uL   RBC 5.24 (H) 3.87 - 5.11 MIL/uL   Hemoglobin 14.1 12.0 - 15.0 g/dL   HCT 44.4 36.0 - 46.0 %   MCV 84.7 80.0 - 100.0 fL   MCH 26.9 26.0 - 34.0 pg   MCHC 31.8 30.0 - 36.0  g/dL   RDW 13.6 11.5 - 15.5 %   Platelets 288 150 - 400 K/uL   nRBC 0.0 0.0 - 0.2 %   Neutrophils Relative % 74 %   Neutro Abs 7.7 1.7 - 7.7 K/uL   Lymphocytes Relative 16 %  Lymphs Abs 1.7 0.7 - 4.0 K/uL   Monocytes Relative 6 %   Monocytes Absolute 0.6 0.1 - 1.0 K/uL   Eosinophils Relative 3 %   Eosinophils Absolute 0.3 0.0 - 0.5 K/uL   Basophils Relative 1 %   Basophils Absolute 0.1 0.0 - 0.1 K/uL   Immature Granulocytes 0 %   Abs Immature Granulocytes 0.03 0.00 - 0.07 K/uL    Comment: Performed at [facility], [address], [place], [place] [address]  Salicylate level     Status: Abnormal   Collection Time: 10/21/22  4:12 PM  Result Value Ref Range   Salicylate Lvl <7.0 (L) 7.0 - 30.0 mg/dL    Comment: Performed at [facility], [address] [place], [place] [address]  Acetaminophen level     Status: Abnormal   Collection Time: 10/21/22  4:12 PM  Result Value Ref Range   Acetaminophen (Tylenol), Serum <10 (L) 10 - 30 ug/mL    Comment: (NOTE) Therapeutic concentrations vary significantly. A range of 10-30 ug/mL  may be an effective concentration for many patients. However, some  are best treated at concentrations outside of this range. Acetaminophen concentrations >150 ug/mL at 4 hours after ingestion  and >50 ug/mL at 12 hours after ingestion are often associated with  toxic reactions.  Performed at [facility], [address] [place], [place] [address]   TSH     Status: None   Collection Time: 10/21/22  4:12 PM  Result Value Ref Range   TSH 2.758 0.350 - 4.500 uIU/mL    Comment: Performed by a 3rd Generation assay with a functional sensitivity of <=0.01 uIU/mL. Performed at [facility], [address] [place], [place] [address]   Urine rapid drug screen (hosp performed)     Status: Abnormal   Collection Time: 10/21/22  4:15 PM  Result Value Ref Range   Opiates NONE DETECTED NONE  DETECTED   Cocaine NONE DETECTED NONE DETECTED   Benzodiazepines POSITIVE (A) NONE DETECTED   Amphetamines POSITIVE (A) NONE DETECTED   Tetrahydrocannabinol POSITIVE (A) NONE DETECTED   Barbiturates NONE DETECTED NONE DETECTED    Comment: (NOTE) DRUG SCREEN FOR MEDICAL PURPOSES ONLY.  IF CONFIRMATION IS NEEDED FOR ANY PURPOSE, NOTIFY LAB WITHIN 5 DAYS.  LOWEST DETECTABLE LIMITS FOR URINE DRUG SCREEN Drug Class                     Cutoff (ng/mL) Amphetamine and metabolites    1000 Barbiturate and metabolites    200 Benzodiazepine                 200 Opiates and metabolites        300 Cocaine and metabolites        300 THC                            50 Performed at [facility], [address], [place], [place] [address]   I-Stat beta hCG blood, ED     Status: None   Collection Time: 10/21/22  4:20 PM  Result Value Ref Range   I-stat hCG, quantitative <5.0 <5 mIU/mL   Comment 3            Comment:   GEST. AGE      CONC.  (mIU/mL)   <=1 WEEK        5 - 50  2 WEEKS       50 - 500     3 WEEKS       100 - 10,000     4 WEEKS     1,000 - 30,000        FEMALE AND NON-PREGNANT FEMALE:     LESS THAN 5 mIU/mL   Resp panel by RT-PCR (RSV, Flu A&B, Covid) Anterior Nasal Swab     Status: None   Collection Time: 10/21/22  4:22 PM   Specimen: Anterior Nasal Swab  Result Value Ref Range   SARS Coronavirus 2 by RT PCR NEGATIVE NEGATIVE    Comment: (NOTE) SARS-CoV-2 target nucleic acids are NOT DETECTED.  The SARS-CoV-2 RNA is generally detectable in upper respiratory specimens during the acute phase of infection. The lowest concentration of SARS-CoV-2 viral copies this assay can detect is 138 copies/mL. A negative result does not preclude SARS-Cov-2 infection and should not be used as the sole basis for treatment or other patient management decisions. A negative result may occur with  improper specimen collection/handling, submission of specimen other than  nasopharyngeal swab, presence of viral mutation(s) within the areas targeted by this assay, and inadequate number of viral copies(<138 copies/mL). A negative result must be combined with clinical observations, patient history, and epidemiological information. The expected result is Negative.  Fact Sheet for Patients:  EntrepreneurPulse.com.au  Fact Sheet for Healthcare Providers:  IncredibleEmployment.be  This test is no t yet approved or cleared by the [place] FDA and  has been authorized for detection and/or diagnosis of SARS-CoV-2 by FDA under an Emergency Use Authorization (EUA). This EUA will remain  in effect (meaning this test can be used) for the duration of the COVID-19 declaration under Section 564(b)(1) of the Act, 21 U.S.C.section 360bbb-3(b)(1), unless the authorization is terminated  or revoked sooner.       Influenza A by PCR NEGATIVE NEGATIVE   Influenza B by PCR NEGATIVE NEGATIVE    Comment: (NOTE) The Xpert Xpress SARS-CoV-2/FLU/RSV plus assay is intended as an aid in the diagnosis of influenza from Nasopharyngeal swab specimens and should not be used as a sole basis for treatment. Nasal washings and aspirates are unacceptable for Xpert Xpress SARS-CoV-2/FLU/RSV testing.  Fact Sheet for Patients: EntrepreneurPulse.com.au  Fact Sheet for Healthcare Providers: IncredibleEmployment.be  This test is not yet approved or cleared by the [place] FDA and has been authorized for detection and/or diagnosis of SARS-CoV-2 by FDA under an Emergency Use Authorization (EUA). This EUA will remain in effect (meaning this test can be used) for the duration of the COVID-19 declaration under Section 564(b)(1) of the Act, 21 U.S.C. section 360bbb-3(b)(1), unless the authorization is terminated or revoked.     Resp Syncytial Virus by PCR NEGATIVE NEGATIVE    Comment: (NOTE) Fact Sheet for  Patients: EntrepreneurPulse.com.au  Fact Sheet for Healthcare Providers: IncredibleEmployment.be  This test is not yet approved or cleared by the [place] FDA and has been authorized for detection and/or diagnosis of SARS-CoV-2 by FDA under an Emergency Use Authorization (EUA). This EUA will remain in effect (meaning this test can be used) for the duration of the COVID-19 declaration under Section 564(b)(1) of the Act, 21 U.S.C. section 360bbb-3(b)(1), unless the authorization is terminated or revoked.  Performed at [facility], [place] [address], [place],  [address]     No current facility-administered medications for this encounter.   Current Outpatient Medications  Medication Sig Dispense Refill   ARIPiprazole (ABILIFY) 10  MG tablet Take 10 mg by mouth daily.     buPROPion (WELLBUTRIN XL) 150 MG 24 hr tablet Take 1 tablet by mouth every morning along with 1 tablet of the 300 mgs for depression. 30 tablet 11   buPROPion (WELLBUTRIN XL) 300 MG 24 hr tablet Take 1 tablet by mouth each morning for depression. 30 tablet 11   cariprazine (VRAYLAR) 1.5 MG capsule Take 1 capsule by mouth each morning for mood stabilization, depression and anxiety. 30 capsule 1   cariprazine (VRAYLAR) 3 MG capsule Take 1 capsule by mouth each morning for mood stabilization, depression and anxiety. 30 capsule 3   FLUoxetine (PROZAC) 20 MG capsule Take 20 mg by mouth daily.     gabapentin (NEURONTIN) 100 MG capsule Take 1 to 2 capsules by mouth every 8 hours as needed for anxiety 180 capsule 5   pantoprazole (PROTONIX) 20 MG tablet TAKE 1 TABLET BY MOUTH EVERY DAY 15 tablet 0    Musculoskeletal: Strength & Muscle Tone: within normal limits Gait & Station: normal Patient leans: Front   Psychiatric Specialty Exam: Presentation  General Appearance:  Casual; Neat  Eye Contact: Good  Speech: Clear and Coherent; Normal Rate  Speech  Volume: Normal  Handedness: Right   Mood and Affect  Mood: Anxious; Angry  Affect: Congruent   Thought Process  Thought Processes: Coherent; Goal Directed; Linear  Descriptions of Associations:Intact  Orientation:Full (Time, Place and Person)  Thought Content:Logical  History of Schizophrenia/Schizoaffective disorder:No data recorded Duration of Psychotic Symptoms:No data recorded Hallucinations:Hallucinations: None  Ideas of Reference:None  Suicidal Thoughts:Suicidal Thoughts: Yes, Active SI Active Intent and/or Plan: Without Intent  Homicidal Thoughts:Homicidal Thoughts: No   Sensorium  Memory: Immediate Good; Recent Good; Remote Good  Judgment: Poor  Insight: Fair   Community education officer  Concentration: Good  Attention Span: Good  Recall: Good  Fund of Knowledge: Good  Language: Good   Psychomotor Activity  Psychomotor Activity: Psychomotor Activity: Normal   Assets  Assets: Communication Skills; Housing; Physical Health    Sleep  Sleep: Sleep: Fair   Physical Exam: Physical Exam Vitals and nursing note reviewed.  Constitutional:      Appearance: Normal appearance.  HENT:     Head: Normocephalic and atraumatic.     Nose: Nose normal.  Cardiovascular:     Rate and Rhythm: Normal rate and regular rhythm.  Pulmonary:     Effort: Pulmonary effort is normal.  Musculoskeletal:        General: Normal range of motion.     Cervical back: Normal range of motion.  Skin:    General: Skin is warm and dry.  Neurological:     Mental Status: She is alert.    Review of Systems  Constitutional: Negative.   HENT: Negative.    Eyes: Negative.   Respiratory: Negative.    Cardiovascular: Negative.   Gastrointestinal: Negative.   Genitourinary: Negative.   Musculoskeletal: Negative.   Skin: Negative.   Neurological: Negative.   Endo/Heme/Allergies: Negative.   Psychiatric/Behavioral:  Positive for depression and suicidal  ideas. The patient is nervous/anxious.    Blood pressure 126/74, pulse 99, temperature 98.9 F (37.2 C), temperature source Oral, resp. rate 14, height 5\' 7"  (1.702 m), last menstrual period 10/19/2022, SpO2 94 %. Body mass index is 43.07 kg/m.  Medical Decision Making: Female, 37 years old appropriately groomed attempted suicide with self harm by cutting her left wrist deeply requiring sutures.  Her stressor is a break up with her boy friend.  Patient remains suicidal with no plan.  She states she just want to die.  She meets criteria for inpatient Psychiatric hospitalization for safety and stabilization.  We will seek bed placement at any facility with available bed.  Problem 1: Suicide attempt  Problem 2: Borderline Personality disorder  Disposition:  Admit, seek bed placement.  Earney Navy, NP-PMHNP-BC 10/21/2022 7:22 PM

## 2022-10-21 NOTE — ED Notes (Signed)
Voluntary paperwork faxed to [phone] per [facility] RN.

## 2022-10-21 NOTE — ED Notes (Addendum)
Applied non-adherent dressing and rolled gauze to superficial lacerations, sutures on left wrist. No bleeding noted.

## 2022-10-21 NOTE — ED Provider Notes (Signed)
[place] EMERGENCY DEPARTMENT AT [facility] Provider Note   CSN: [id number] Arrival date & time: 10/21/22  1603     History  Chief Complaint  Patient presents with   Drug Overdose    States swallowed #28 0.5mg  Ativan     Jillian [name] is a 37 y.o. female.   Drug Overdose     37 year old female with medical history significant for anxiety and depression who presents to the emergency department after an intentional overdose and suicide attempt.  The patient reportedly ingested between 23 and 28 0.5 mg of Ativan tablets around 3 PM.  She additionally states that she cut her left wrist.  She is unsure of her last tetanus status.  She denies any other coingestions.  Presents with EMS voluntarily.  She states that she was suicidal due to break-up with her boyfriend.  Has been feeling suicidal since this past Saturday.  She denies any respiratory complaints, arrives GCS 15, ABC intact, laceration noted to the left wrist.    Home Medications Prior to Admission medications   Medication Sig Start Date End Date Taking? Authorizing Provider  ARIPiprazole (ABILIFY) 10 MG tablet Take 10 mg by mouth daily.    [provider]  buPROPion (WELLBUTRIN XL) 150 MG 24 hr tablet Take 1 tablet by mouth every morning along with 1 tablet of the 300 mgs for depression. 05/01/22     buPROPion (WELLBUTRIN XL) 300 MG 24 hr tablet Take 1 tablet by mouth each morning for depression. 05/01/22     cariprazine (VRAYLAR) 1.5 MG capsule Take 1 capsule by mouth each morning for mood stabilization, depression and anxiety. 04/27/22     cariprazine (VRAYLAR) 3 MG capsule Take 1 capsule by mouth each morning for mood stabilization, depression and anxiety. 05/01/22     FLUoxetine (PROZAC) 20 MG capsule Take 20 mg by mouth daily.    [provider]  gabapentin (NEURONTIN) 100 MG capsule Take 1 to 2 capsules by mouth every 8 hours as needed for anxiety 05/28/22     pantoprazole (PROTONIX) 20 MG tablet  TAKE 1 TABLET BY MOUTH EVERY DAY 11/20/20   Billie Ruddy, MD      Allergies    Ibuprofen    Review of Systems   Review of Systems  Skin:  Positive for wound.  Psychiatric/Behavioral:  Positive for suicidal ideas.   All other systems reviewed and are negative.   Physical Exam Updated Vital Signs BP (!) 143/91   Pulse (!) 115   Temp 98.9 F (37.2 C) (Oral)   Resp (!) 32   Ht 5\' 7"  (1.702 m)   LMP 10/19/2022   SpO2 93%   BMI 43.07 kg/m  Physical Exam Vitals and nursing note reviewed.  Constitutional:      General: She is not in acute distress. HENT:     Head: Normocephalic and atraumatic.  Eyes:     Conjunctiva/sclera: Conjunctivae normal.     Pupils: Pupils are equal, round, and reactive to light.  Cardiovascular:     Rate and Rhythm: Normal rate and regular rhythm.  Pulmonary:     Effort: Pulmonary effort is normal. No respiratory distress.  Abdominal:     General: There is no distension.     Tenderness: There is no guarding.  Musculoskeletal:        General: Signs of injury present. No deformity.     Cervical back: Neck supple.     Comments: 3 cm laceration through the dermis and  epidermis to the left wrist, hemostatic, left upper extremity neurovascularly intact, intact motor function along the median, ulnar, radial nerve distributions, 2+ radial pulses.  Skin:    Findings: No lesion or rash.  Neurological:     General: No focal deficit present.     Mental Status: She is alert. Mental status is at baseline.  Psychiatric:        Attention and Perception: Attention and perception normal.        Mood and Affect: Mood and affect normal.        Speech: Speech normal.        Behavior: Behavior normal. Behavior is cooperative.        Thought Content: Thought content includes suicidal ideation. Thought content includes suicidal plan.     ED Results / Procedures / Treatments   Labs (all labs ordered are listed, but only abnormal results are displayed) Labs  Reviewed  RAPID URINE DRUG SCREEN, HOSP PERFORMED - Abnormal; Notable for the following components:      Result Value   Benzodiazepines POSITIVE (*)    Amphetamines POSITIVE (*)    Tetrahydrocannabinol POSITIVE (*)    All other components within normal limits  CBC WITH DIFFERENTIAL/PLATELET - Abnormal; Notable for the following components:   RBC 5.24 (*)    All other components within normal limits  SALICYLATE LEVEL - Abnormal; Notable for the following components:   Salicylate Lvl <2.5 (*)    All other components within normal limits  ACETAMINOPHEN LEVEL - Abnormal; Notable for the following components:   Acetaminophen (Tylenol), Serum <10 (*)    All other components within normal limits  RESP PANEL BY RT-PCR (RSV, FLU A&B, COVID)  RVPGX2  COMPREHENSIVE METABOLIC PANEL  ETHANOL  TSH  I-STAT BETA HCG BLOOD, ED ([facility], [facility], [facility] ONLY)    EKG EKG Interpretation  Date/Time:  Tuesday October 21 2022 16:36:10 EST Ventricular Rate:  108 PR Interval:  138 QRS Duration: 86 QT Interval:  320 QTC Calculation: 429 R Axis:   46 Text Interpretation: Sinus tachycardia Confirmed by Regan Lemming ([id number]) on 10/21/2022 5:35:13 PM  Radiology No results found.  Procedures .Marland KitchenLaceration Repair  Date/Time: 10/21/2022 4:19 PM  Performed by: Regan Lemming, MD Authorized by: Regan Lemming, MD   Consent:    Consent obtained:  Verbal   Consent given by:  Patient   Risks discussed:  Infection and pain Universal protocol:    Patient identity confirmed:  Verbally with patient and arm band Anesthesia:    Anesthesia method:  Local infiltration   Local anesthetic:  Lidocaine 1% WITH epi Laceration details:    Location:  Shoulder/arm   Shoulder/arm location:  L lower arm   Length (cm):  3   Depth (mm):  2 Pre-procedure details:    Preparation:  Patient was prepped and draped in usual sterile fashion Exploration:    Contaminated: no   Treatment:    Area cleansed with:  Shur-Clens   Amount of  cleaning:  Standard Skin repair:    Repair method:  Sutures   Suture size:  3-0   Suture material:  Nylon   Suture technique:  Simple interrupted   Number of sutures:  2 Approximation:    Approximation:  Close Repair type:    Repair type:  Simple Post-procedure details:    Dressing:  Open (no dressing)   Procedure completion:  Tolerated     Medications Ordered in ED Medications  Tdap (BOOSTRIX) injection 0.5 mL (has no administration in time  range)  lidocaine-EPINEPHrine (XYLOCAINE W/EPI) 2 %-1:200000 (PF) injection 10 mL (has no administration in time range)    ED Course/ Medical Decision Making/ A&P                             Medical Decision Making Amount and/or Complexity of Data Reviewed Labs: ordered.  Risk Prescription drug management.   37 year old female with medical history significant for anxiety and depression who presents to the emergency department after an intentional overdose and suicide attempt.  The patient reportedly ingested between 23 and 28 0.5 mg of Ativan tablets around 3 PM.  She additionally states that she cut her left wrist with a regular straight razorblade.  She is unsure of her last tetanus status.  She denies any other coingestions.  Presents with EMS voluntarily.  She states that she was suicidal due to break-up with her boyfriend.  Has been feeling suicidal since this past Saturday.  She denies any respiratory complaints, arrives GCS 15, ABC intact, laceration noted to the left wrist.  On arrival, the patient was vitally stable, mildly tachycardic heart rate 103, BP 134/96, saturating 96% on room air, afebrile.  Physical exam significant for 3 cm laceration through the dermis and epidermis to the left wrist, hemostatic, left upper extremity neurovascularly intact, intact motor function along the median, ulnar, radial nerve distributions, 2+ radial pulses.  The patient was observed in the emergency department for 3 hours postingestion and she  demonstrated no effects of Ativan ingestion.  Tmax absorption for oral Ativan tablets is approximately 2 hours.  The patient maintained a GCS 15, exhibited no signs of respiratory distress or alteration in her mental status.  She remained hemodynamically stable, mildly tachycardic in the ER.  Her laceration was repaired bedside with two 3-0 nylon sutures.  She was advised wound care instructions, infectious return precautions, advised for suture removal in 7 to 10 days.  Her tetanus was updated in the emergency department.  Laboratory evaluation significant for UDS positive for benzodiazepines, amphetamines and THC, ethanol level normal, Tylenol and salicylate levels negative, CBC without leukocytosis or anemia, MP unremarkable, i-STAT hCG normal, a TSH normal, COVID-19, influenza, RSV PCR testing negative.  As the patient demonstrated no effects of Ativan within the Tmax expected for her oral Ativan tablets, I believe that the patient is currently medically clear for TTS consultation.  TTS evaluated the patient, plan for voluntary [facility] admission.   Final Clinical Impression(s) / ED Diagnoses Final diagnoses:  Suicide attempt [facility])    Rx / Dana Orders ED Discharge Orders     None         Regan Lemming, MD 10/21/22 [id number]

## 2022-10-21 NOTE — Progress Notes (Signed)
Pt was accepted to [facility] 10/21/22; Bed Assignment 404-01   Pt meets inpatient criteria per Delfin Gant, NP-PMHNP-BC   Attending Physician will be Dr. Janine Limbo  Report can be called to: Adult unit: [phone]  Pt can arrive after: [facility] will coordinate   Care Team notified: [place] Wilmer, RN, [employer], Harlene Ramus, RN  [place], [place] 10/21/2022 @ 11:07 PM

## 2022-10-21 NOTE — ED Notes (Signed)
Pt belongings, sweatshirt and home medications placed in x1 belonging bag and secured with staff.

## 2022-10-22 ENCOUNTER — Encounter (HOSPITAL_COMMUNITY): Payer: Self-pay

## 2022-10-22 ENCOUNTER — Inpatient Hospital Stay (HOSPITAL_COMMUNITY)
Admission: AD | Admit: 2022-10-22 | Discharge: 2022-10-25 | DRG: 885 | Disposition: A | Discharge status: Home / Self Care | Payer: BC Managed Care – PPO | Attending: Psychiatry | Admitting: Psychiatry

## 2022-10-22 ENCOUNTER — Encounter (HOSPITAL_COMMUNITY): Payer: Self-pay | Admitting: Psychiatry

## 2022-10-22 DIAGNOSIS — Z23 Encounter for immunization: Secondary | ICD-10-CM | POA: Diagnosis not present

## 2022-10-22 DIAGNOSIS — F1210 Cannabis abuse, uncomplicated: Secondary | ICD-10-CM | POA: Diagnosis present

## 2022-10-22 DIAGNOSIS — Z9151 Personal history of suicidal behavior: Secondary | ICD-10-CM

## 2022-10-22 DIAGNOSIS — Z1152 Encounter for screening for COVID-19: Secondary | ICD-10-CM | POA: Diagnosis not present

## 2022-10-22 DIAGNOSIS — S61512A Laceration without foreign body of left wrist, initial encounter: Secondary | ICD-10-CM | POA: Diagnosis present

## 2022-10-22 DIAGNOSIS — G4700 Insomnia, unspecified: Secondary | ICD-10-CM | POA: Diagnosis present

## 2022-10-22 DIAGNOSIS — X788XXA Intentional self-harm by other sharp object, initial encounter: Secondary | ICD-10-CM | POA: Diagnosis present

## 2022-10-22 DIAGNOSIS — Z79899 Other long term (current) drug therapy: Secondary | ICD-10-CM

## 2022-10-22 DIAGNOSIS — F1310 Sedative, hypnotic or anxiolytic abuse, uncomplicated: Secondary | ICD-10-CM | POA: Diagnosis present

## 2022-10-22 DIAGNOSIS — F332 Major depressive disorder, recurrent severe without psychotic features: Principal | ICD-10-CM | POA: Diagnosis present

## 2022-10-22 DIAGNOSIS — F411 Generalized anxiety disorder: Secondary | ICD-10-CM | POA: Diagnosis present

## 2022-10-22 DIAGNOSIS — R45851 Suicidal ideations: Secondary | ICD-10-CM | POA: Diagnosis present

## 2022-10-22 DIAGNOSIS — F1510 Other stimulant abuse, uncomplicated: Secondary | ICD-10-CM | POA: Diagnosis present

## 2022-10-22 DIAGNOSIS — F603 Borderline personality disorder: Secondary | ICD-10-CM | POA: Diagnosis present

## 2022-10-22 MED ORDER — MAGNESIUM HYDROXIDE 400 MG/5ML PO SUSP: 30.0000 mL | Freq: Every day | ORAL | Status: DC | PRN

## 2022-10-22 MED ORDER — TRAZODONE HCL 50 MG PO TABS
50.0000 mg | ORAL_TABLET | Freq: Every evening | ORAL | Status: DC | PRN
  Administered 2022-10-22 – 2022-10-24 (×4): 50 mg via ORAL
  Filled 2022-10-22 (×4): qty 1

## 2022-10-22 MED ORDER — GABAPENTIN 100 MG PO CAPS
200.0000 mg | ORAL_CAPSULE | Freq: Every day | ORAL | Status: DC
  Administered 2022-10-22 – 2022-10-25 (×4): 200 mg via ORAL
  Filled 2022-10-22 (×7): qty 2

## 2022-10-22 MED ORDER — CARIPRAZINE HCL 3 MG PO CAPS
3.0000 mg | ORAL_CAPSULE | Freq: Every day | ORAL | Status: DC
  Administered 2022-10-22 – 2022-10-25 (×4): 3 mg via ORAL
  Filled 2022-10-22 (×6): qty 1

## 2022-10-22 MED ORDER — GABAPENTIN 300 MG PO CAPS
300.0000 mg | ORAL_CAPSULE | Freq: Every day | ORAL | Status: DC
  Administered 2022-10-22 – 2022-10-24 (×3): 300 mg via ORAL
  Filled 2022-10-22 (×6): qty 1

## 2022-10-22 MED ORDER — FLUOXETINE HCL 20 MG PO CAPS
80.0000 mg | ORAL_CAPSULE | Freq: Every day | ORAL | Status: DC
  Administered 2022-10-22 – 2022-10-25 (×4): 80 mg via ORAL
  Filled 2022-10-22 (×6): qty 4

## 2022-10-22 MED ORDER — ALUM & MAG HYDROXIDE-SIMETH 200-200-20 MG/5ML PO SUSP: 30.0000 mL | ORAL | Status: DC | PRN

## 2022-10-22 MED ORDER — HYDROXYZINE HCL 25 MG PO TABS
25.0000 mg | ORAL_TABLET | Freq: Three times a day (TID) | ORAL | Status: DC | PRN
  Administered 2022-10-22 – 2022-10-24 (×7): 25 mg via ORAL
  Filled 2022-10-22 (×7): qty 1

## 2022-10-22 MED ORDER — ACETAMINOPHEN 325 MG PO TABS: 650.0000 mg | ORAL_TABLET | Freq: Four times a day (QID) | ORAL | Status: DC | PRN

## 2022-10-22 NOTE — BH IP Treatment Plan (Signed)
Interdisciplinary Treatment and Diagnostic Plan Update  10/22/2022 Time of Session: 10:30am  Jillian [name] MRN: [id number]  Principal Diagnosis: Suicidal ideations  Secondary Diagnoses: Principal Problem:   Suicidal ideations   Current Medications:  Current Facility-Administered Medications  Medication Dose Route Frequency Provider Last Rate Last Admin   acetaminophen (TYLENOL) tablet 650 mg  650 mg Oral Q6H PRN Evette Georges, NP       alum & mag hydroxide-simeth (MAALOX/MYLANTA) 200-200-20 MG/5ML suspension 30 mL  30 mL Oral Q4H PRN Evette Georges, NP       hydrOXYzine (ATARAX) tablet 25 mg  25 mg Oral TID PRN Onuoha, Chinwendu V, NP   25 mg at 10/22/22 1006   magnesium hydroxide (MILK OF MAGNESIA) suspension 30 mL  30 mL Oral Daily PRN Evette Georges, NP       traZODone (DESYREL) tablet 50 mg  50 mg Oral QHS PRN Onuoha, Chinwendu V, NP   50 mg at 10/22/22 0147   PTA Medications: Medications Prior to Admission  Medication Sig Dispense Refill Last Dose   amphetamine-dextroamphetamine (ADDERALL) 15 MG tablet Take 22.5 mg by mouth daily.      gabapentin (NEURONTIN) 100 MG capsule Take 300 mg by mouth at bedtime.      buPROPion (WELLBUTRIN XL) 150 MG 24 hr tablet Take 1 tablet by mouth every morning along with 1 tablet of the 300 mgs for depression. 30 tablet 11    buPROPion (WELLBUTRIN XL) 300 MG 24 hr tablet Take 1 tablet by mouth each morning for depression. 30 tablet 11    cariprazine (VRAYLAR) 3 MG capsule Take 1 capsule by mouth each morning for mood stabilization, depression and anxiety. 30 capsule 3    FLUoxetine (PROZAC) 20 MG capsule Take 80 mg by mouth daily.      gabapentin (NEURONTIN) 100 MG capsule Take 1 to 2 capsules by mouth every 8 hours as needed for anxiety (Patient taking differently: Take 200 mg by mouth daily.) 180 capsule 5     Patient Stressors:    Patient Strengths:    Treatment Modalities: Medication Management, Group therapy, Case management,  1 to 1 session  with clinician, Psychoeducation, Recreational therapy.   Physician Treatment Plan for Primary Diagnosis: Suicidal ideations Long Term Goal(s): Improvement in symptoms so as ready for discharge   Short Term Goals: Ability to identify and develop effective coping behaviors will improve Ability to maintain clinical measurements within normal limits will improve Compliance with prescribed medications will improve Ability to identify triggers associated with substance abuse/mental health issues will improve Ability to identify changes in lifestyle to reduce recurrence of condition will improve Ability to verbalize feelings will improve Ability to disclose and discuss suicidal ideas Ability to demonstrate self-control will improve  Medication Management: Evaluate patient's response, side effects, and tolerance of medication regimen.  Therapeutic Interventions: 1 to 1 sessions, Unit Group sessions and Medication administration.  Evaluation of Outcomes: Not Met  Physician Treatment Plan for Secondary Diagnosis: Principal Problem:   Suicidal ideations  Long Term Goal(s): Improvement in symptoms so as ready for discharge   Short Term Goals: Ability to identify and develop effective coping behaviors will improve Ability to maintain clinical measurements within normal limits will improve Compliance with prescribed medications will improve Ability to identify triggers associated with substance abuse/mental health issues will improve Ability to identify changes in lifestyle to reduce recurrence of condition will improve Ability to verbalize feelings will improve Ability to disclose and discuss suicidal ideas Ability to demonstrate self-control will  improve     Medication Management: Evaluate patient's response, side effects, and tolerance of medication regimen.  Therapeutic Interventions: 1 to 1 sessions, Unit Group sessions and Medication administration.  Evaluation of Outcomes: Not  Met   RN Treatment Plan for Primary Diagnosis: Suicidal ideations Long Term Goal(s): Knowledge of disease and therapeutic regimen to maintain health will improve  Short Term Goals: Ability to remain free from injury will improve, Ability to participate in decision making will improve, Ability to verbalize feelings will improve, Ability to disclose and discuss suicidal ideas, and Ability to identify and develop effective coping behaviors will improve  Medication Management: RN will administer medications as ordered by provider, will assess and evaluate patient's response and provide education to patient for prescribed medication. RN will report any adverse and/or side effects to prescribing provider.  Therapeutic Interventions: 1 on 1 counseling sessions, Psychoeducation, Medication administration, Evaluate responses to treatment, Monitor vital signs and CBGs as ordered, Perform/monitor CIWA, COWS, AIMS and Fall Risk screenings as ordered, Perform wound care treatments as ordered.  Evaluation of Outcomes: Not Met   LCSW Treatment Plan for Primary Diagnosis: Suicidal ideations Long Term Goal(s): Safe transition to appropriate next level of care at discharge, Engage patient in therapeutic group addressing interpersonal concerns.  Short Term Goals: Engage patient in aftercare planning with referrals and resources, Increase social support, Increase emotional regulation, Facilitate acceptance of mental health diagnosis and concerns, Identify triggers associated with mental health/substance abuse issues, and Increase skills for wellness and recovery  Therapeutic Interventions: Assess for all discharge needs, 1 to 1 time with Social worker, Explore available resources and support systems, Assess for adequacy in community support network, Educate family and significant other(s) on suicide prevention, Complete Psychosocial Assessment, Interpersonal group therapy.  Evaluation of Outcomes: Not  Met   Progress in Treatment: Attending groups: Yes. Participating in groups: Yes. Taking medication as prescribed: Yes. Toleration medication: Yes. Family/Significant other contact made: Yes, individual(s) contacted:  Mother  Patient understands diagnosis: Yes. Discussing patient identified problems/goals with staff: Yes. Medical problems stabilized or resolved: Yes. Denies suicidal/homicidal ideation: Yes. Issues/concerns per patient self-inventory: No.   New problem(s) identified: No, Describe:  None   New Short Term/Long Term Goal(s): medication stabilization, elimination of SI thoughts, development of comprehensive mental wellness plan.   Patient Goals:  Did not attend   Discharge Plan or Barriers: Patient recently admitted. CSW will continue to follow and assess for appropriate referrals and possible discharge planning.   Reason for Continuation of Hospitalization: Anxiety Depression Medication stabilization Suicidal ideation  Estimated Length of Stay: 3 to 7 days   Last [place] Suicide Severity Risk Score: [place] Admission (Current) from 10/22/2022 in [place] 400B ED from 10/21/2022 in [facility] Emergency Department at [place] High Risk No Risk       Last [facility] 2/9 Scores:     No data to display          Scribe for Treatment Team: Carney Harder 10/22/2022 2:39 PM

## 2022-10-22 NOTE — Progress Notes (Signed)
   Initial Treatment Plan 10/22/2022 1:15 Jillian [name] MRN: [id number]       PATIENT STRESSORS: Loss of romantic relationship  Codependency      PATIENT STRENGTHS: Mother's support  Communication skills      PATIENT IDENTIFIED PROBLEMS: Poor coping skills  Loss of romantic relationship  Depression  Anxiety   Codependency   Self-worth                 DISCHARGE CRITERIA:  Ability to meet basic life and health needs Improved stabilization in mood, thinking, and/or behavior Verbal commitment to aftercare and medication compliance   PRELIMINARY DISCHARGE PLAN: Outpatient therapy Return to previous living arrangement   PATIENT/FAMILY INVOLVEMENT: This treatment plan has been presented to and reviewed with the patient,Jillian [name], The patient has been given the opportunity to ask questions and make suggestions.   [place] M. Smith Robert, RN 10/22/2022
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
## 2022-10-22 NOTE — ED Notes (Signed)
Pt escorted by me to [facility] vehicle at ED entrance.

## 2022-10-22 NOTE — Progress Notes (Signed)
Pt in room today. Pt medication compliant. Pt presents with tearful, anxious affect. Pt endorses increased anxiety. Pt utilized prn atarax with minimal efficacy. Provider notified. No new self injurious behavior reported or observed. Pt does contract for safety. Q 15 minute checks ongoing.

## 2022-10-22 NOTE — [facility] Group Notes (Signed)
Adult Psychoeducational Group Note  Date:  10/22/2022 Time:  7:46 PM  Group Topic/Focus:  BING: Helps to improve memory and other cognitive skills, as well as strengthen focus and concentration.   Participation Level:  Did Not Attend  Jillian [name] 10/22/2022, 7:46 PM

## 2022-10-22 NOTE — ED Notes (Signed)
Report called to [facility]. [facility] called for pt transportation to [facility].

## 2022-10-22 NOTE — H&P (Signed)
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 Goal(s): Improvement in symptoms so as ready for discharge  Short Term Goals: Ability to identify changes in lifestyle to reduce recurrence of condition will improve, Ability to verbalize feelings will improve, Ability to disclose and discuss suicidal ideas, and Ability to demonstrate self-control will improve  Physician Treatment Plan for Secondary Diagnosis: Principal Problem:   Major depressive disorder, recurrent episode, severe ([place]) Active Problems:   Suicidal  ideations   GAD (generalized anxiety disorder)  Long Term Goal(s): Improvement in symptoms so as ready for discharge  Short Term Goals: Ability to identify and develop effective coping behaviors will improve, Ability to maintain clinical measurements within normal limits will improve, Compliance with prescribed medications will improve, and Ability to identify triggers associated with substance abuse/mental health issues will improve  I certify that inpatient services furnished can reasonably be expected to improve the patient's condition.    Lindell Spar, NP, pmhnp, fnp-bc. 1/31/20243:07 PM

## 2022-10-22 NOTE — BHH Group Notes (Signed)
Adult Psychoeducational Group Note  Date:  10/22/2022 Time:  4:59 PM  Group Topic/Focus:  Goals Group:   The focus of this group is to help patients establish daily goals to achieve during treatment and discuss how the patient can incorporate goal setting into their daily lives to aide in recovery.  Participation Level:  Did Not Attend  Jillian [name] 10/22/2022, 4:59 PM
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
## 2022-10-22 NOTE — Progress Notes (Signed)
Adult Psychoeducational Group Note  Date:  10/22/2022 Time:  10:35 PM  Group Topic/Focus:  Wrap-Up Group:   The focus of this group is to help patients review their daily goal of treatment and discuss progress on daily workbooks.  Participation Level:  Did Not Attend  Participation Quality:   n/a  Affect:   n/a  Cognitive:   n/a  Insight: None  Engagement in Group:   n/a  Modes of Intervention:   n/a  Additional Comments:   Did not attend.  Wetzel Bjornstad Emiliana Blaize 10/22/2022, 10:35 PM
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
## 2022-10-22 NOTE — BHH Suicide Risk Assessment (Signed)
[place] INPATIENT:  Family/Significant Other Suicide Prevention Education  Suicide Prevention Education:  Education Completed; Jillian [name] [phone] (Mother) has been identified by the patient as the family member/significant other with whom the patient will be residing, and identified as the person(s) who will aid the patient in the event of a mental health crisis (suicidal ideations/suicide attempt).  With written consent from the patient, the family member/significant other has been provided the following suicide prevention education, prior to the and/or following the discharge of the patient.  The suicide prevention education provided includes the following: Suicide risk factors Suicide prevention and interventions National Suicide Hotline telephone number [facility] assessment telephone number [facility] Emergency Assistance [place] and/or Residential Mobile Crisis Unit telephone number  Request made of family/significant other to: Remove weapons (e.g., guns, rifles, knives), all items previously/currently identified as safety concern.   Remove drugs/medications (over-the-counter, prescriptions, illicit drugs), all items previously/currently identified as a safety concern.  The family member/significant other verbalizes understanding of the suicide prevention education information provided.  The family member/significant other agrees to remove the items of safety concern listed above.  CSW spoke with Mrs. Stepanian who states that she is currently returning from [facility].  She states that she will be back a there home late tonight and will be able to "be with my daughter daily if needed after that".  She states that her daughter lives alone but that she lives close to her daughter and visits her "a couple times a week".  She states that there a no firearms or weapons in her daughters home.  Mrs. Costanzo states that she would like a phone call from her daughters  provider to discuss medications and a treatment plan.  Mrs. Keesling also states that her daughter sees Dr. Louie Casa Reddling in [place] and "he can be reached on his direct number at [phone]".  Mrs. Chirino states "I would like the doctor at the hospital to contact her current Psychiatrist to discuss her medications to make sure everything is right for my daughter".  CSW completed SPE with Mrs. Rizzo.   Jillian [name] 10/22/2022, 2:02 PM

## 2022-10-22 NOTE — Progress Notes (Signed)
   10/22/22 0400  Psych Admission Type (Psych Patients Only)  Admission Status Voluntary  Psychosocial Assessment  Patient Complaints Crying spells;Decreased concentration;Anxiety;Depression;Self-harm thoughts;Sadness;Worrying  Eye Contact Brief  Facial Expression Sad  Affect Depressed;Anxious;Sad  Speech Logical/coherent  Interaction Poor  Motor Activity Slow  Appearance/Hygiene In scrubs  Behavior Characteristics Anxious;Appropriate to situation  Mood Depressed;Anxious;Sad;Worthless, low self-esteem  Aggressive Behavior  Effect Self-harm  Thought Process  Coherency WDL  Content WDL  Delusions None reported or observed  Perception WDL  Hallucination None reported or observed  Judgment Impaired  Confusion None  Danger to Self  Current suicidal ideation? Active  Description of Suicide Plan cut wrist or overdose on medication  Self-Injurious Behavior Some self-injurious ideation observed or expressed.  No lethal plan expressed   Agreement Not to Harm Self Yes  Description of Agreement verbal  Danger to Others  Danger to Others None reported or observed

## 2022-10-22 NOTE — Progress Notes (Signed)
Pt approached nurses station, tearful and anxious this afternoon at approximately 1500. RN attended to pt, administering scheduled medications and offering to talk with pt after. Pt agreed to sitting down with RN and discussing feelings of anxiety and isolation. Pt reported to RN that pt has difficulty being around peers in the dayroom, so pt has mostly remained in her room today. Pt reports "only going to work and back home" when she is not in the hospital. Pt began to express feeling isolated and hopeless after "a friend ended their relationship." Pt noted to remain tearful and circumstantial throughout conversation, fidgeting and wringing hands. Pt acknowledged that she does not feel that she has coping skills that she can utilize in the hospital. Pt also stated that she had not made a goal for the day. RN validated pt feelings, provided a self inventory sheet to pt, and encouraged pt to take time while inpatient to come up with goals for herself. RN also encouraged pt to attend groups in order to help with feelings of isolation. Pt calmed, receptive to conversation and started to work on self inventory sheet following conversation with Therapist, sports.

## 2022-10-23 DIAGNOSIS — F332 Major depressive disorder, recurrent severe without psychotic features: Secondary | ICD-10-CM | POA: Diagnosis not present

## 2022-10-23 NOTE — Group Note (Signed)
Date:  10/23/2022 Time:  10:07 AM  Group Topic/Focus:  Orientation:   The focus of this group is to educate the patient on the purpose and policies of crisis stabilization and provide a format to answer questions about their admission.  The group details unit policies and expectations of patients while admitted.    Participation Level:  Active  Participation Quality:  Appropriate  Affect:  Appropriate  Cognitive:  Appropriate  Insight: Appropriate  Engagement in Group:  Engaged  Modes of Intervention:  Discussion  Additional Comments:     Jerrye Beavers 10/23/2022, 10:07 AM
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
## 2022-10-23 NOTE — Group Note (Signed)
Date:  10/23/2022 Time:  4:32 PM  Group Topic/Focus:  Wellness Toolbox:   The focus of this group is to discuss various aspects of wellness, balancing those aspects and exploring ways to increase the ability to experience wellness.  Patients will create a wellness toolbox for use upon discharge.    Participation Level:  Active  Participation Quality:  Appropriate  Affect:  Appropriate  Cognitive:  Appropriate  Insight: Appropriate  Engagement in Group:  Engaged  Modes of Intervention:  Exploration  Additional Comments:   Patient Practiced Daily mindfulness technique to cope with painful, unpleasant, and hurtful things in his life.  Jerrye Beavers 10/23/2022, 4:32 PM

## 2022-10-23 NOTE — Progress Notes (Signed)

## 2022-10-23 NOTE — [facility] Group Notes (Signed)
Patient did not attend the Wrap-up group. 

## 2022-10-23 NOTE — Progress Notes (Signed)
Patient signed a 72 hr form today at 0940.

## 2022-10-23 NOTE — Progress Notes (Signed)
   10/23/22 2200  Psych Admission Type (Psych Patients Only)  Admission Status Voluntary  Psychosocial Assessment  Patient Complaints Anxiety;Other (Comment) ("i just feel kind of bleh")  Eye Contact Fair  Facial Expression Flat;Anxious  Affect Preoccupied;Anxious;Depressed  Speech Logical/coherent  Interaction Assertive  Motor Activity Slow  Appearance/Hygiene Unremarkable  Behavior Characteristics Cooperative  Mood Depressed;Anxious;Preoccupied  Thought Process  Coherency WDL  Content WDL  Delusions None reported or observed  Perception WDL  Hallucination None reported or observed  Judgment Impaired  Confusion None  Danger to Self  Current suicidal ideation? Passive  Agreement Not to Harm Self Yes  Description of Agreement verbal  Danger to Others  Danger to Others None reported or observed

## 2022-10-23 NOTE — Progress Notes (Signed)
   10/23/22 1036  Psych Admission Type (Psych Patients Only)  Admission Status Voluntary  Psychosocial Assessment  Patient Complaints Anxiety  Eye Contact Fair  Facial Expression Anxious  Affect Anxious  Speech Logical/coherent  Interaction Assertive  Motor Activity Slow  Appearance/Hygiene In hospital gown  Behavior Characteristics Cooperative  Mood Depressed;Anxious  Aggressive Behavior  Effect Self-harm  Thought Process  Coherency WDL  Content WDL  Delusions None reported or observed  Perception WDL  Hallucination None reported or observed  Judgment Impaired  Confusion None  Danger to Self  Current suicidal ideation? Passive  Agreement Not to Harm Self Yes  Description of Agreement verbal  Danger to Others  Danger to Others None reported or observed

## 2022-10-24 DIAGNOSIS — F332 Major depressive disorder, recurrent severe without psychotic features: Secondary | ICD-10-CM | POA: Diagnosis not present

## 2022-10-24 LAB — LIPID PANEL
Cholesterol: 219 mg/dL — ABNORMAL HIGH (ref 0–200)
HDL: 42 mg/dL (ref >40)
LDL Cholesterol: 151 mg/dL — ABNORMAL HIGH (ref 0–99)
Total CHOL/HDL Ratio: 5.2 RATIO
Triglycerides: 131 mg/dL (ref <150)
VLDL: 26 mg/dL (ref 0–40)
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
## 2022-10-24 NOTE — [facility] Group Notes (Signed)
Pt attended AA meeting this evening.  

## 2022-10-24 NOTE — Group Note (Signed)
Recreation Therapy Group Note   Group Topic:Health and Wellness  Group Date: 10/24/2022 Start Time: 1840 End Time: 0958 Facilitators: Theone Bowell-McCall, LRT,CTRS Location: [address] Dayroom   Goal Area(s) Addresses:  Patient will define components of whole wellness. Patient will verbalize benefit of whole wellness.  Group Description: Mental Gymnastics. LRT and patients discussed the components of wellness (mental, physical and spiritual). LRT and patients also discussed the importance of wellness and how it affects [place] on a daily basis. LRT then gave patients two worksheets of brain teasers. LRT explained to patients instead of doing physical exercise, they were going to exercise their brains and solve the brain teasers presented on the worksheets. Patients were given 20 minutes to decode as many of brain teasers they could before they went over them as a group     Affect/Mood: Appropriate   Participation Level: Engaged   Participation Quality: Independent   Behavior: Appropriate   Speech/Thought Process: Focused   Insight: Good   Judgement: Good   Modes of Intervention: Worksheet   Patient Response to Interventions:  Engaged   Education Outcome:  Acknowledges education and In group clarification offered    Clinical Observations/Individualized Feedback: Pt engaged with peers in completing the puzzles.  Pt was appropriate throughout group session.     Plan: Continue to engage patient in RT group sessions 2-3x/week.   Javeria Briski-McCall, LRT,CTRS 10/24/2022 11:17 AM

## 2022-10-24 NOTE — Progress Notes (Signed)
[facility] MD progress note : Update  This is an update to the progress note.  The patient was evaluated in the afternoon and she continues to progress well.  She has signed a 72-hour request for discharge that expires on Sunday but she is requesting an earlier discharge and it was verbally communicated with her that she will be discharged on Saturday, 10/25/2022.  The patient is in agreement with the plan. Today when she was reevaluated she was alert, oriented, cooperative and fairly pleasant.  She reports that she is much improved since admission.  Her depression has improved from a 10/10 to a 5/10 today.  This is about the average level that she has on a daily basis.  Her suicidal ideations are now down to 0/10.  Her anxiety remains slightly high at a 5-7.  She denied any adverse side effects on medications.  She is currently on all her home medications except for Wellbutrin and Adderall.  These medications were held in the ED on admission and weight to be reviewed.  When discussed with the patient, she was comfortable with the holding of the Adderall and the Wellbutrin and thought that she was doing much better even without them.  She could potentially discuss with her MD, Dr.Randy Readling upon discharge.  I contacted the patient's mother Jilda Kress at [phone] and had a comprehensive discussion about her care and the plan to discharge the tomorrow with a safety plan.  Her mother plans to visit her during visitation today and will come back on Saturday to pick her up after discharge.  I also contacted Dr. Louie Casa Readling at [phone].  He indicated that he would like to get off some of the medications anyway and wanted her to be off the Adderall and Wellbutrin until he has a chance to reevaluate her.  He is comfortable with the current medication regimen.  Plan: Continue with the treatment as planned.  Discharge on 10/25/2022.  Total Time Spent in Direct Patient Care:  I personally spent 30 minutes on the  unit in direct patient care, calls to her mother and doctor.The direct patient care time included face-to-face time with the patient, reviewing the patient's chart, communicating with other professionals, and coordinating care. Greater than 50% of this time was spent in counseling or coordinating care with the patient regarding goals of hospitalization, psycho-education, and discharge planning needs.   [place] Psychiatrist

## 2022-10-24 NOTE — Progress Notes (Signed)
   10/24/22 0545  15 Minute Checks  Location Bedroom  Visual Appearance Calm  Behavior Sleeping  Sleep (Behavioral Health Patients Only)  Calculate sleep? (Click Yes once per 24 hr at 0600 safety check) Yes  Documented sleep last 24 hours 9.75

## 2022-10-24 NOTE — Progress Notes (Signed)
   10/24/22 0902  Psych Admission Type (Psych Patients Only)  Admission Status Voluntary  Psychosocial Assessment  Patient Complaints Anxiety  Eye Contact Fair  Facial Expression Anxious;Flat  Affect Anxious;Preoccupied  Speech Logical/coherent  Interaction Assertive  Motor Activity Slow  Appearance/Hygiene Unremarkable  Behavior Characteristics Cooperative  Mood Depressed;Anxious;Preoccupied  Thought Process  Coherency WDL  Content WDL  Delusions None reported or observed  Perception WDL  Hallucination None reported or observed  Judgment Impaired  Confusion None  Danger to Self  Current suicidal ideation? Denies  Agreement Not to Harm Self Yes  Description of Agreement Verbal  Danger to Others  Danger to Others None reported or observed

## 2022-10-25 DIAGNOSIS — F332 Major depressive disorder, recurrent severe without psychotic features: Secondary | ICD-10-CM | POA: Diagnosis not present

## 2022-10-25 LAB — HEMOGLOBIN A1C
Hgb A1c MFr Bld: 5.2 % (ref 4.8–5.6)
Mean Plasma Glucose: 102.54 mg/dL

## 2022-10-25 MED ORDER — GABAPENTIN 300 MG PO CAPS: 300.0000 mg | ORAL_CAPSULE | Freq: Every day | ORAL | 0 refills | Status: AC

## 2022-10-25 MED ORDER — CARIPRAZINE HCL 3 MG PO CAPS: 3.0000 mg | ORAL_CAPSULE | Freq: Every day | ORAL | 0 refills | Status: AC

## 2022-10-25 MED ORDER — GABAPENTIN 100 MG PO CAPS: 200.0000 mg | ORAL_CAPSULE | Freq: Every day | ORAL | 0 refills | Status: AC

## 2022-10-25 MED ORDER — HYDROXYZINE HCL 25 MG PO TABS: 25.0000 mg | ORAL_TABLET | Freq: Three times a day (TID) | ORAL | 0 refills | Status: AC | PRN

## 2022-10-25 MED ORDER — FLUOXETINE HCL 40 MG PO CAPS: 80.0000 mg | ORAL_CAPSULE | Freq: Every day | ORAL | 0 refills | Status: AC

## 2022-10-25 MED ORDER — TRAZODONE HCL 50 MG PO TABS: 50.0000 mg | ORAL_TABLET | Freq: Every evening | ORAL | 0 refills | Status: AC | PRN

## 2022-10-25 NOTE — Progress Notes (Signed)
Pt discharged from facility at this time. Pt left with her mother. Pt removed all belongings, valuables, and verbalized understanding of medications and follow up care. Pt denies suicidal and homicidal thoughts as well as thoughts of self harm. Pt denies auditory and visual hallucinations.

## 2022-10-25 NOTE — Discharge Summary (Signed)
Physician Discharge Summary Note  Patient:  Jillian [name] is an 37 y.o., female MRN:  [id number] DOB:  [birth year] Patient phone:  [phone] (home)  Patient address:   [address] [place] [address],  Total Time spent with patient: 30 minutes  Date of Admission:  10/22/2022 Date of Discharge: 10/25/2022  Reason for Admission: This is the second psychiatric admissions/evaluation in this [facility] for this 37 year old Caucasian female with hx of chronic mental illnesses, suicidal thoughts/numerous suicide attempts by overdose & self-mutilating behaviors. She was patient in this [facility] last in 2009. She is admitted to the [facility] this time round from the [facility] with complain of suicide attempt by overdose on Ativan & self-inflicted laceration to her inner left fore-arm that required three stiches to close. This was apparently triggered by a relationship break-up. After medical evaluation, stabilization & clearance, she was brought to the [facility] for further psychiatric evaluation/treatments.   Principal Problem: Major depressive disorder, recurrent episode, severe ([place]) Discharge Diagnoses: Principal Problem:   Major depressive disorder, recurrent episode, severe ([place]) Active Problems:   Suicidal ideations   GAD (generalized anxiety disorder)  Past Psychiatric History: As above  Past Medical History:  Past Medical History:  Diagnosis Date   Depression    History reviewed. No pertinent surgical history. Family History:  Family History  Adopted: Yes   Family Psychiatric  History: See H & P Social History:  Social History   Substance and Sexual Activity  Alcohol Use Yes   Comment: Social     Social History   Substance and Sexual Activity  Drug Use Yes   Types: Marijuana   Comment: daily    Social History   Socioeconomic History   Marital status: Single    Spouse name: Not on file   Number of children: Not on file   Years of education: Not on file    Highest education level: Not on file  Occupational History   Not on file  Tobacco Use   Smoking status: Never   Smokeless tobacco: Never  Substance and Sexual Activity   Alcohol use: Yes    Comment: Social   Drug use: Yes    Types: Marijuana    Comment: daily   Sexual activity: Not on file  Other Topics Concern   Not on file  Social History Narrative   Not on file   Social Determinants of Health   Financial Resource Strain: Not on file  Food Insecurity: No Food Insecurity (10/22/2022)   Hunger Vital Sign    Worried About Running Out of Food in the Last Year: Never true    Ran Out of Food in the Last Year: Never true  Transportation Needs: No Transportation Needs (10/22/2022)   PRAPARE - Hydrologist (Medical): No    Lack of Transportation (Non-Medical): No  Physical Activity: Not on file  Stress: Not on file  Social Connections: Not on file                              HOSPITAL COURSE During the patient's hospitalization, patient had extensive initial psychiatric evaluation, and follow-up psychiatric evaluations every day. Psychiatric diagnoses provided upon initial assessment are as listed above. Patient's psychiatric medications were adjusted on admission as follows: -Resume Vraylar 3 mg po daily for mood stability.  -Resumed Fluoxetine 80 mg po daily for depression.  -Resume gabapentin 200 mg po  daily for agitation/anxiety.  -Resumed gabapentin 300 mg po Q hs for agitation/anxiety.  -Resumed Trazodone 100 mg po Q hs for insomnia.  -Continue Vistaril 25 mg po tid prn for anxiety.  All of the above medications were pt's prior to admission medications.   During the hospitalization, other adjustments were made to the patient's psychiatric medication regimen as follows: Trazodone was reduced to 50 mg nightly as needed and no other adjustments were made. Pt has been educated to continue taking medications as listed above after discharge, and f/u with  her outpatient mental health provider.   Patient's care was discussed during the interdisciplinary team meeting every day during the hospitalization. The patient denies having side effects to prescribed psychiatric medication. Gradually, patient started adjusting to milieu. The patient was evaluated each day by a clinical provider to ascertain response to treatment. Improvement was noted by the patient's report of decreasing symptoms, improved sleep and appetite, affect, medication tolerance, behavior, and participation in unit programming.  Patient was asked each day to complete a self inventory noting mood, mental status, pain, new symptoms, anxiety and concerns.     Symptoms were reported as significantly decreased or resolved completely by discharge. On day of discharge, the patient reports that their mood is stable. The patient denied having suicidal thoughts for more than 48 hours prior to discharge.  Patient denies having homicidal thoughts.  Patient denies having auditory hallucinations.  Patient denies any visual hallucinations or other symptoms of psychosis. The patient was motivated to continue taking medication with a goal of continued improvement in mental health.    The patient reports their target psychiatric symptoms of depression, anxiety & depression responded well to the psychiatric medications, and the patient reports overall benefit from this psychiatric hospitalization. Supportive psychotherapy was provided to the patient. The patient also participated in regular group therapy while hospitalized. Coping skills, problem solving as well as relaxation therapies were also part of the unit programming.   Labs were reviewed with the patient, and abnormal results were discussed with the patient. Lipid profile with cholesterol of 219, LDL-151, educated on healthy food choices and exercise and the need to f/u with her PCP. UDS +Benzos, +THC, +Amphetamines, educated on the negative impacts of  substance use on her mental health and on the importance of cessation. TSH WNL. CBC WNL. CMP WNL. Hemoglobin A1C pending at time of discharge, educated to f/u with PCP or her MH provider to have this lab completed. EKG with Qtc WNL at 429. Patient has been educated to f/u with her Primary care provider for suture removal to her left forearm or to go to the closest urgent care to her home to have sutures evaluated and removed.   The patient is able to verbalize their individual safety plan to this provider.   # It is recommended to the patient to continue psychiatric medications as prescribed, after discharge from the hospital.     # It is recommended to the patient to follow up with your outpatient psychiatric provider and PCP.   # It was discussed with the patient, the impact of alcohol, drugs, tobacco have been there overall psychiatric and medical wellbeing, and total abstinence from substance use was recommended the patient.ed.   # Prescriptions provided or sent directly to preferred pharmacy at discharge. Patient agreeable to plan. Given opportunity to ask questions. Appears to feel comfortable with discharge.    # In the event of worsening symptoms, the patient is instructed to call the crisis hotline (  988), 911 and or go to the nearest ED for appropriate evaluation and treatment of symptoms. To follow-up with primary care provider for other medical issues, concerns and or health care needs   # Patient was discharged home with a plan to follow up as noted below.  Total Time spent with patient: 30 minutes  Physical Findings: AIMS: 0 CIWA:  n/a COWS:  n/a  Musculoskeletal: Strength & Muscle Tone: within normal limits Gait & Station: normal Patient leans: N/A  Psychiatric Specialty Exam:  Presentation  General Appearance:  Appropriate for Environment; Fairly Groomed  Eye Contact: Good  Speech: Clear and Coherent  Speech Volume: Normal  Handedness: Right   Mood and  Affect  Mood: Euthymic  Affect: Appropriate; Congruent   Thought Process  Thought Processes: Coherent  Descriptions of Associations:Intact  Orientation:Full (Time, Place and Person)  Thought Content:Logical  History of Schizophrenia/Schizoaffective disorder:No data recorded Duration of Psychotic Symptoms:No data recorded Hallucinations:Hallucinations: None  Ideas of Reference:None  Suicidal Thoughts:Suicidal Thoughts: No  Homicidal Thoughts:Homicidal Thoughts: No   Sensorium  Memory: Immediate Good  Judgment: Good  Insight: Good   Executive Functions  Concentration: Good  Attention Span: Good  Recall: [place] of Knowledge: Good  Language: Good   Psychomotor Activity  Psychomotor Activity: Psychomotor Activity: Normal   Assets  Assets: Communication Skills   Sleep  Sleep: Sleep: Good Number of Hours of Sleep: 8    Physical Exam: Physical Exam Constitutional:      Appearance: Normal appearance.  HENT:     Nose: Nose normal.  Eyes:     Pupils: Pupils are equal, round, and reactive to light.  Pulmonary:     Effort: Pulmonary effort is normal.  Musculoskeletal:     Cervical back: Normal range of motion.  Neurological:     Mental Status: She is alert and oriented to person, place, and time.    Review of Systems  Constitutional: Negative.   HENT: Negative.    Respiratory: Negative.    Cardiovascular: Negative.   Gastrointestinal: Negative.   Genitourinary: Negative.   Musculoskeletal: Negative.   Skin: Negative.   Neurological: Negative.   Psychiatric/Behavioral:  Positive for depression (Denies SI, denies HI/AVH, verbally contracts for safety outside of [place]) and substance abuse (Educated on substance abuse cessation and on the negative impact on her mental health). Negative for hallucinations, memory loss and suicidal ideas. The patient is nervous/anxious (improving on current medications) and has insomnia  (improving on current medications).    Blood pressure (!) 139/90, pulse 92, temperature 98 F (36.7 C), temperature source Oral, resp. rate 16, height 5\' 7"  (1.702 m), weight 96.2 kg, last menstrual period 10/19/2022, SpO2 100 %. Body mass index is 33.2 kg/m.   Social History   Tobacco Use  Smoking Status Never  Smokeless Tobacco Never   Tobacco Cessation:  N/A, patient does not currently use tobacco products   Blood Alcohol level:  Lab Results  Component Value Date   ETH <10 10/21/2022   ETH  03/26/2008    <5        LOWEST DETECTABLE LIMIT FOR SERUM ALCOHOL IS 11 mg/dL FOR MEDICAL PURPOSES ONLY    Metabolic Disorder Labs:  Lab Results  Component Value Date   HGBA1C 5.2 10/25/2022   MPG 102.54 10/25/2022   No results found for: "PROLACTIN" Lab Results  Component Value Date   CHOL 219 (H) 10/24/2022   TRIG 131 10/24/2022   HDL 42 10/24/2022   CHOLHDL 5.2 10/24/2022  VLDL 26 10/24/2022   LDLCALC 151 (H) 10/24/2022    See Psychiatric Specialty Exam and Suicide Risk Assessment completed by Attending Physician prior to discharge.  Discharge destination:  Home  Is patient on multiple antipsychotic therapies at discharge:  No   Has Patient had three or more failed trials of antipsychotic monotherapy by history:  No  Recommended Plan for Multiple Antipsychotic Therapies: NA   Allergies as of 10/25/2022       Reactions   Ibuprofen Swelling        Medication List     STOP taking these medications    amphetamine-dextroamphetamine 15 MG tablet Commonly known as: ADDERALL   buPROPion 150 MG 24 hr tablet Commonly known as: Wellbutrin XL   buPROPion 300 MG 24 hr tablet Commonly known as: Wellbutrin XL       TAKE these medications      Indication  cariprazine 3 MG capsule Commonly known as: Vraylar Take 1 capsule (3 mg total) by mouth daily. Start taking on: October 26, 2022 What changed:  how much to take when to take this  Indication: Major  Depressive Disorder   FLUoxetine 40 MG capsule Commonly known as: PROzac Take 2 capsules (80 mg total) by mouth daily. What changed: medication strength  Indication: Depression   gabapentin 300 MG capsule Commonly known as: Neurontin Take 1 capsule (300 mg total) by mouth at bedtime. What changed: medication strength  Indication: Generalized Anxiety Disorder   gabapentin 100 MG capsule Commonly known as: NEURONTIN Take 2 capsules (200 mg total) by mouth daily. Start taking on: October 26, 2022 What changed:  how much to take when to take this reasons to take this  Indication: Generalized Anxiety Disorder   hydrOXYzine 25 MG tablet Commonly known as: ATARAX Take 1 tablet (25 mg total) by mouth 3 (three) times daily as needed for anxiety.  Indication: Feeling Anxious, Feeling Tense   traZODone 50 MG tablet Commonly known as: DESYREL Take 1 tablet (50 mg total) by mouth at bedtime as needed for sleep.  Indication: Trouble Sleeping        Follow-up Information     [facility] Schedule an appointment as soon as possible for a visit.   Why: Please call to personally schedule an appointment with your therapist, as we were unable to contact. Contact information: [address] [place] [address] [phone]         [facility]. Go on 10/30/2022.   Why: You have an appointment for medication management services on 10/30/22 at 8:00 am.  This appointment will be held in person. Contact information: [place], [place] [address]  Phone: [phone]               Signed: Nicholes Rough, NP 10/25/2022, 3:38 PM

## 2022-10-25 NOTE — Progress Notes (Signed)
  [facility] Adult Case Management Discharge Plan :  Will you be returning to the same living situation after discharge:  No.  Is going to stay with her mother for awhile until back to normal then return to her own place At discharge, do you have transportation home?: Yes,  mother Do you have the ability to pay for your medications: Yes,  insurance  Release of information consent forms completed and emailed to Medical Records, then turned in to Medical Records by CSW.   Patient to Follow up at:  Follow-up Information     [facility] Schedule an appointment as soon as possible for a visit.   Why: Please call to personally schedule an appointment with your therapist, as we were unable to contact. Contact information: [address] [place] [address] [phone]         [facility]. Go on 10/30/2022.   Why: You have an appointment for medication management services on 10/30/22 at 8:00 am.  This appointment will be held in person. Contact information: [place], [place] [address]  Phone: [phone]                Next level of care provider has access to [place] and Suicide Prevention discussed: Yes,  with mother     Has patient been referred to the Quitline?: N/A patient is not a smoker  Patient has been referred for addiction treatment: Yes  Maretta Los, LCSW 10/25/2022, 9:03 AM

## 2022-10-25 NOTE — [facility] Suicide Risk Assessment (Signed)
Suicide Risk Assessment  Discharge Assessment    [facility] Discharge Suicide Risk Assessment   Principal Problem: Major depressive disorder, recurrent episode, severe ([place]) Discharge Diagnoses: Principal Problem:   Major depressive disorder, recurrent episode, severe ([place]) Active Problems:   Suicidal ideations   GAD (generalized anxiety disorder)  History of Present Illness: This is the second psychiatric admissions/evaluation in this [facility] for this 37 year old Caucasian female with hx of chronic mental illnesses, suicidal thoughts/numerous suicide attempts by overdose & self-mutilating behaviors. She was patient in this [facility] last in 2009. She is admitted to the [facility] this time round from the [facility] with complain of suicide attempt by overdose on Ativan & self-inflicted laceration to her inner left fore-arm that required three stiches to close. This was apparently triggered by a relationship break-up. After medical evaluation, stabilization & clearance, she was brought to the [facility] for further psychiatric evaluation/treatments.                                 HOSPITAL COURSE During the patient's hospitalization, patient had extensive initial psychiatric evaluation, and follow-up psychiatric evaluations every day. Psychiatric diagnoses provided upon initial assessment are as listed above. Patient's psychiatric medications were adjusted on admission as follows: -Resume Vraylar 3 mg po daily for mood stability.  -Resumed Fluoxetine 80 mg po daily for depression.  -Resume gabapentin 200 mg po daily for agitation/anxiety.  -Resumed gabapentin 300 mg po Q hs for agitation/anxiety.  -Resumed Trazodone 100 mg po Q hs for insomnia.  -Continue Vistaril 25 mg po tid prn for anxiety.  All of the above medications were pt's prior to admission medications.  During the hospitalization, other adjustments were made to the patient's psychiatric medication regimen as follows: Trazodone was reduced to 50 mg  nightly as needed and no other adjustments were made. Pt has been educated to continue taking medications as listed above after discharge, and f/u with her outpatient mental health provider.  Patient's care was discussed during the interdisciplinary team meeting every day during the hospitalization. The patient denies having side effects to prescribed psychiatric medication. Gradually, patient started adjusting to milieu. The patient was evaluated each day by a clinical provider to ascertain response to treatment. Improvement was noted by the patient's report of decreasing symptoms, improved sleep and appetite, affect, medication tolerance, behavior, and participation in unit programming.  Patient was asked each day to complete a self inventory noting mood, mental status, pain, new symptoms, anxiety and concerns.    Symptoms were reported as significantly decreased or resolved completely by discharge. On day of discharge, the patient reports that their mood is stable. The patient denied having suicidal thoughts for more than 48 hours prior to discharge.  Patient denies having homicidal thoughts.  Patient denies having auditory hallucinations.  Patient denies any visual hallucinations or other symptoms of psychosis. The patient was motivated to continue taking medication with a goal of continued improvement in mental health.   The patient reports their target psychiatric symptoms of depression, anxiety & depression responded well to the psychiatric medications, and the patient reports overall benefit from this psychiatric hospitalization. Supportive psychotherapy was provided to the patient. The patient also participated in regular group therapy while hospitalized. Coping skills, problem solving as well as relaxation therapies were also part of the unit programming.  Labs were reviewed with the patient, and abnormal results were discussed with the patient. Lipid profile with cholesterol of  219, LDL-151,  educated on healthy food choices and exercise and the need to f/u with her PCP. UDS +Benzos, +THC, +Amphetamines, educated on the negative impacts of substance use on her mental health and on the importance of cessation. TSH WNL. CBC WNL. CMP WNL. Hemoglobin A1C pending at time of discharge, educated to f/u with PCP or her [place] provider to have this lab completed. EKG with Qtc WNL at 429. Patient has been educated to f/u with her Primary care provider for suture removal to her left forearm or to go to the closest urgent care to her home to have sutures evaluated and removed.  The patient is able to verbalize their individual safety plan to this provider.  # It is recommended to the patient to continue psychiatric medications as prescribed, after discharge from the hospital.    # It is recommended to the patient to follow up with your outpatient psychiatric provider and PCP.  # It was discussed with the patient, the impact of alcohol, drugs, tobacco have been there overall psychiatric and medical wellbeing, and total abstinence from substance use was recommended the patient.ed.  # Prescriptions provided or sent directly to preferred pharmacy at discharge. Patient agreeable to plan. Given opportunity to ask questions. Appears to feel comfortable with discharge.    # In the event of worsening symptoms, the patient is instructed to call the crisis hotline (988), 911 and or go to the nearest ED for appropriate evaluation and treatment of symptoms. To follow-up with primary care provider for other medical issues, concerns and or health care needs  # Patient was discharged home with a plan to follow up as noted below.  Total Time spent with patient: 30 minutes  Musculoskeletal: Strength & Muscle Tone: within normal limits Gait & Station: normal Patient leans: N/A  Psychiatric Specialty Exam  Presentation  General Appearance:  Appropriate for Environment; Fairly Groomed  Eye  Contact: Good  Speech: Clear and Coherent  Speech Volume: Normal  Handedness: Right   Mood and Affect  Mood: Euthymic  Duration of Depression Symptoms: No data recorded Affect: Appropriate; Congruent   Thought Process  Thought Processes: Coherent  Descriptions of Associations:Intact  Orientation:Full (Time, Place and Person)  Thought Content:Logical  History of Schizophrenia/Schizoaffective disorder:No data recorded Duration of Psychotic Symptoms:No data recorded Hallucinations:Hallucinations: None  Ideas of Reference:None  Suicidal Thoughts:Suicidal Thoughts: No  Homicidal Thoughts:Homicidal Thoughts: No   Sensorium  Memory: Immediate Good  Judgment: Good  Insight: Good   Executive Functions  Concentration: Good  Attention Span: Good  Recall: Jillian [name] of Knowledge: Good  Language: Good   Psychomotor Activity  Psychomotor Activity: Psychomotor Activity: Normal   Assets  Assets: Communication Skills   Sleep  Sleep: Sleep: Good Number of Hours of Sleep: 8   Physical Exam: Physical Exam Constitutional:      Appearance: Normal appearance.  HENT:     Head: Normocephalic.  Eyes:     Pupils: Pupils are equal, round, and reactive to light.  Musculoskeletal:        General: Normal range of motion.     Cervical back: Normal range of motion.  Neurological:     General: No focal deficit present.     Mental Status: She is alert and oriented to person, place, and time.    Review of Systems  Constitutional:  Negative for fever.  HENT: Negative.    Eyes: Negative.  Negative for blurred vision.  Cardiovascular:  Negative for chest pain.  Neurological:  Negative for dizziness.  Psychiatric/Behavioral:  Positive for depression (Denies SI, denies HI, denies AVH, verbally contracts for safety outside of [facility]) and substance abuse (educated on the negative impact of substance use on her mental health). Negative for  hallucinations, memory loss and suicidal ideas. The patient is nervous/anxious (resolving on current meds) and has insomnia (resolving on current medications).    Blood pressure (!) 139/90, pulse 92, temperature 98 F (36.7 C), temperature source Oral, resp. rate 16, height 5\' 7"  (1.702 m), weight 96.2 kg, last menstrual period 10/19/2022, SpO2 100 %. Body mass index is 33.2 kg/m.  Mental Status Per Nursing Assessment::   On Admission:  Suicidal ideation indicated by patient, Self-harm thoughts, Suicide plan  Demographic Factors:  Living alone  Loss Factors: Loss of significant relationship  Historical Factors: Prior suicide attempts and Impulsivity  Risk Reduction Factors:   Positive social support  Continued Clinical Symptoms:  Personality Disorders:   Cluster B  Cognitive Features That Contribute To Risk:  None    Suicide Risk:  Mild:  There are no identifiable suicide plans, no associated intent, mild dysphoria and related symptoms, good self-control (both objective and subjective assessment), few other risk factors, and identifiable protective factors, including available and accessible social support.    Follow-up Information     [facility] Schedule an appointment as soon as possible for a visit.   Why: Please call to personally schedule an appointment with your therapist, as we were unable to contact. Contact information: [address] [place] [address] [phone]         [facility]. Go on 10/30/2022.   Why: You have an appointment for medication management services on 10/30/22 at 8:00 am.  This appointment will be held in person. Contact information: [address], [place] [address]  Phone: [phone]               Nicholes Rough, NP 10/25/2022, 10:02 AM

## 2022-10-25 NOTE — Progress Notes (Signed)
   10/24/22 2102  Psych Admission Type (Psych Patients Only)  Admission Status Voluntary  Psychosocial Assessment  Patient Complaints Anxiety  Eye Contact Fair  Facial Expression Flat;Anxious  Affect Anxious;Depressed;Preoccupied  Speech Logical/coherent  Interaction Assertive  Motor Activity Slow  Appearance/Hygiene Unremarkable  Behavior Characteristics Cooperative  Mood Depressed;Anxious;Preoccupied  Thought Process  Coherency WDL  Content WDL  Delusions None reported or observed  Perception WDL  Hallucination None reported or observed  Judgment Impaired  Confusion None  Danger to Self  Current suicidal ideation? Denies  Agreement Not to Harm Self Yes  Description of Agreement verbal  Danger to Others  Danger to Others None reported or observed

## 2022-10-25 NOTE — Plan of Care (Signed)
  Problem: Coping: Goal: Coping ability will improve Outcome: Progressing   Problem: Medication: Goal: Compliance with prescribed medication regimen will improve Outcome: Progressing   Problem: Self-Concept: Goal: Level of anxiety will decrease Outcome: Progressing   Problem: Coping: Goal: Coping ability will improve Outcome: Progressing Goal: Will verbalize feelings Outcome: Progressing

## 2022-10-25 NOTE — Progress Notes (Signed)
   10/25/22 0545  15 Minute Checks  Location Bedroom  Visual Appearance Calm  Behavior Sleeping  Sleep (Behavioral Health Patients Only)  Calculate sleep? (Click Yes once per 24 hr at 0600 safety check) Yes  Documented sleep last 24 hours 8

## 2022-10-30 ENCOUNTER — Ambulatory Visit
Admission: EM | Admit: 2022-10-30 | Discharge: 2022-10-30 | Disposition: A | Discharge status: Home / Self Care | Payer: BC Managed Care – PPO

## 2022-10-30 DIAGNOSIS — Z4802 Encounter for removal of sutures: Secondary | ICD-10-CM

## 2022-10-30 NOTE — Discharge Instructions (Addendum)
Replace your Steri-Strips daily.  Leave the wound open to the air and keep it clean, dry.  For cleaning the wound, use warm soapy water.  Do not use alcohol or peroxide.  If you will be exposed to really dirty environment can cover your wound.  Otherwise we expect to see back on Sunday to have the last suture removed.

## 2022-10-30 NOTE — ED Provider Notes (Signed)
[facility]  Note:  This document was prepared using Systems analyst and may include unintentional dictation errors.  MRN: [id number] DOB: [birth year]  Subjective:   Jillian [name] is a 37 y.o. female presenting for suture removal.  Patient has had the sutures placed for 8 days now.  This is over the left wrist.  Was seen through the emergency room.  Refer to documentation from that visit for further details regarding the wound.  No fever, drainage pus or bleeding.  Patient is taking care of the wound very well.  No current facility-administered medications for this encounter.  Current Outpatient Medications:    cariprazine (VRAYLAR) 3 MG capsule, Take 1 capsule (3 mg total) by mouth daily., Disp: 30 capsule, Rfl: 0   FLUoxetine (PROZAC) 40 MG capsule, Take 2 capsules (80 mg total) by mouth daily., Disp: 60 capsule, Rfl: 0   gabapentin (NEURONTIN) 100 MG capsule, Take 2 capsules (200 mg total) by mouth daily., Disp: 60 capsule, Rfl: 0   gabapentin (NEURONTIN) 300 MG capsule, Take 1 capsule (300 mg total) by mouth at bedtime., Disp: 30 capsule, Rfl: 0   hydrOXYzine (ATARAX) 25 MG tablet, Take 1 tablet (25 mg total) by mouth 3 (three) times daily as needed for anxiety., Disp: 30 tablet, Rfl: 0   traZODone (DESYREL) 50 MG tablet, Take 1 tablet (50 mg total) by mouth at bedtime as needed for sleep., Disp: 30 tablet, Rfl: 0   Allergies  Allergen Reactions   Ibuprofen Swelling    Past Medical History:  Diagnosis Date   Depression      No past surgical history on file.  Family History  Adopted: Yes    Social History   Tobacco Use   Smoking status: Never   Smokeless tobacco: Never  Substance Use Topics   Alcohol use: Yes    Comment: Social   Drug use: Yes    Types: Marijuana    Comment: daily    ROS   Objective:   Vitals: BP 111/64 (BP Location: Right Arm)   Pulse 86   Temp 98.4 F (36.9 C) (Oral)   Resp 16   LMP 10/19/2022    Physical Exam Constitutional:      General: She is not in acute distress.    Appearance: Normal appearance. She is well-developed. She is not ill-appearing, toxic-appearing or diaphoretic.  HENT:     Head: Normocephalic and atraumatic.     Nose: Nose normal.     Mouth/Throat:     Mouth: Mucous membranes are moist.  Eyes:     General: No scleral icterus.       Right eye: No discharge.        Left eye: No discharge.     Extraocular Movements: Extraocular movements intact.  Cardiovascular:     Rate and Rhythm: Normal rate.  Pulmonary:     Effort: Pulmonary effort is normal.  Musculoskeletal:       Hands:  Skin:    General: Skin is warm and dry.  Neurological:     General: No focal deficit present.     Mental Status: She is alert and oriented to person, place, and time.  Psychiatric:        Mood and Affect: Mood normal.        Behavior: Behavior normal.     Steri-Strips were applied to the wound.  The last remaining suture was left in place to avoid further wound dehiscence.  No drainage of  pus or bleeding.  Patient tolerated this well.  Assessment and Plan :   PDMP not reviewed this encounter.  1. Encounter for removal of sutures     Discussed wound care.  No signs of wound infection.  Patient is to return in 4 days for suture removal of the last remaining suture.    Jaynee Eagles, [place] 10/30/22 1042

## 2022-10-30 NOTE — ED Triage Notes (Signed)
Pt for suture removal left inner wrist-place 1/30-well healed-sutures x 2 in place-pt NAD-steady gait

## 2022-11-04 ENCOUNTER — Ambulatory Visit (HOSPITAL_COMMUNITY): Payer: Self-pay

## 2023-01-14 DIAGNOSIS — F603 Borderline personality disorder: Secondary | ICD-10-CM | POA: Diagnosis not present

## 2023-01-14 DIAGNOSIS — F310 Bipolar disorder, current episode hypomanic: Secondary | ICD-10-CM | POA: Diagnosis not present

## 2023-01-21 DIAGNOSIS — F603 Borderline personality disorder: Secondary | ICD-10-CM | POA: Diagnosis not present

## 2023-01-21 DIAGNOSIS — F310 Bipolar disorder, current episode hypomanic: Secondary | ICD-10-CM | POA: Diagnosis not present

## 2023-02-04 DIAGNOSIS — F310 Bipolar disorder, current episode hypomanic: Secondary | ICD-10-CM | POA: Diagnosis not present

## 2023-02-04 DIAGNOSIS — F603 Borderline personality disorder: Secondary | ICD-10-CM | POA: Diagnosis not present

## 2023-02-11 DIAGNOSIS — F603 Borderline personality disorder: Secondary | ICD-10-CM | POA: Diagnosis not present

## 2023-02-11 DIAGNOSIS — F310 Bipolar disorder, current episode hypomanic: Secondary | ICD-10-CM | POA: Diagnosis not present

## 2023-02-25 DIAGNOSIS — F603 Borderline personality disorder: Secondary | ICD-10-CM | POA: Diagnosis not present

## 2023-02-25 DIAGNOSIS — F310 Bipolar disorder, current episode hypomanic: Secondary | ICD-10-CM | POA: Diagnosis not present

## 2023-03-04 DIAGNOSIS — F310 Bipolar disorder, current episode hypomanic: Secondary | ICD-10-CM | POA: Diagnosis not present

## 2023-03-04 DIAGNOSIS — F603 Borderline personality disorder: Secondary | ICD-10-CM | POA: Diagnosis not present

## 2023-03-11 DIAGNOSIS — F310 Bipolar disorder, current episode hypomanic: Secondary | ICD-10-CM | POA: Diagnosis not present

## 2023-03-11 DIAGNOSIS — F603 Borderline personality disorder: Secondary | ICD-10-CM | POA: Diagnosis not present

## 2023-03-18 DIAGNOSIS — F310 Bipolar disorder, current episode hypomanic: Secondary | ICD-10-CM | POA: Diagnosis not present

## 2023-03-18 DIAGNOSIS — F603 Borderline personality disorder: Secondary | ICD-10-CM | POA: Diagnosis not present

## 2023-03-25 DIAGNOSIS — F310 Bipolar disorder, current episode hypomanic: Secondary | ICD-10-CM | POA: Diagnosis not present

## 2023-03-25 DIAGNOSIS — F603 Borderline personality disorder: Secondary | ICD-10-CM | POA: Diagnosis not present

## 2023-04-01 DIAGNOSIS — F310 Bipolar disorder, current episode hypomanic: Secondary | ICD-10-CM | POA: Diagnosis not present

## 2023-04-01 DIAGNOSIS — F603 Borderline personality disorder: Secondary | ICD-10-CM | POA: Diagnosis not present

## 2023-04-08 DIAGNOSIS — F310 Bipolar disorder, current episode hypomanic: Secondary | ICD-10-CM | POA: Diagnosis not present

## 2023-04-08 DIAGNOSIS — F603 Borderline personality disorder: Secondary | ICD-10-CM | POA: Diagnosis not present

## 2023-04-22 DIAGNOSIS — F603 Borderline personality disorder: Secondary | ICD-10-CM | POA: Diagnosis not present

## 2023-04-22 DIAGNOSIS — F310 Bipolar disorder, current episode hypomanic: Secondary | ICD-10-CM | POA: Diagnosis not present

## 2023-04-29 DIAGNOSIS — F603 Borderline personality disorder: Secondary | ICD-10-CM | POA: Diagnosis not present

## 2023-04-29 DIAGNOSIS — F310 Bipolar disorder, current episode hypomanic: Secondary | ICD-10-CM | POA: Diagnosis not present

## 2023-05-13 DIAGNOSIS — F603 Borderline personality disorder: Secondary | ICD-10-CM | POA: Diagnosis not present

## 2023-05-13 DIAGNOSIS — F310 Bipolar disorder, current episode hypomanic: Secondary | ICD-10-CM | POA: Diagnosis not present

## 2023-06-03 DIAGNOSIS — F310 Bipolar disorder, current episode hypomanic: Secondary | ICD-10-CM | POA: Diagnosis not present

## 2023-06-03 DIAGNOSIS — F603 Borderline personality disorder: Secondary | ICD-10-CM | POA: Diagnosis not present

## 2023-06-10 DIAGNOSIS — F310 Bipolar disorder, current episode hypomanic: Secondary | ICD-10-CM | POA: Diagnosis not present

## 2023-06-10 DIAGNOSIS — F603 Borderline personality disorder: Secondary | ICD-10-CM | POA: Diagnosis not present

## 2023-06-17 DIAGNOSIS — F603 Borderline personality disorder: Secondary | ICD-10-CM | POA: Diagnosis not present

## 2023-06-17 DIAGNOSIS — F310 Bipolar disorder, current episode hypomanic: Secondary | ICD-10-CM | POA: Diagnosis not present

## 2023-07-01 DIAGNOSIS — F310 Bipolar disorder, current episode hypomanic: Secondary | ICD-10-CM | POA: Diagnosis not present

## 2023-07-01 DIAGNOSIS — F603 Borderline personality disorder: Secondary | ICD-10-CM | POA: Diagnosis not present

## 2023-07-01 DIAGNOSIS — F3281 Premenstrual dysphoric disorder: Secondary | ICD-10-CM | POA: Diagnosis not present

## 2023-07-16 DIAGNOSIS — F310 Bipolar disorder, current episode hypomanic: Secondary | ICD-10-CM | POA: Diagnosis not present

## 2023-07-16 DIAGNOSIS — F3281 Premenstrual dysphoric disorder: Secondary | ICD-10-CM | POA: Diagnosis not present

## 2023-07-16 DIAGNOSIS — F603 Borderline personality disorder: Secondary | ICD-10-CM | POA: Diagnosis not present

## 2023-07-22 DIAGNOSIS — F603 Borderline personality disorder: Secondary | ICD-10-CM | POA: Diagnosis not present

## 2023-07-22 DIAGNOSIS — F3281 Premenstrual dysphoric disorder: Secondary | ICD-10-CM | POA: Diagnosis not present

## 2023-07-22 DIAGNOSIS — F310 Bipolar disorder, current episode hypomanic: Secondary | ICD-10-CM | POA: Diagnosis not present

## 2023-08-05 DIAGNOSIS — F310 Bipolar disorder, current episode hypomanic: Secondary | ICD-10-CM | POA: Diagnosis not present

## 2023-08-05 DIAGNOSIS — F603 Borderline personality disorder: Secondary | ICD-10-CM | POA: Diagnosis not present

## 2023-08-05 DIAGNOSIS — F3281 Premenstrual dysphoric disorder: Secondary | ICD-10-CM | POA: Diagnosis not present

## 2023-08-12 DIAGNOSIS — F603 Borderline personality disorder: Secondary | ICD-10-CM | POA: Diagnosis not present

## 2023-08-12 DIAGNOSIS — F3281 Premenstrual dysphoric disorder: Secondary | ICD-10-CM | POA: Diagnosis not present

## 2023-08-12 DIAGNOSIS — F310 Bipolar disorder, current episode hypomanic: Secondary | ICD-10-CM | POA: Diagnosis not present

## 2023-08-27 DIAGNOSIS — F310 Bipolar disorder, current episode hypomanic: Secondary | ICD-10-CM | POA: Diagnosis not present

## 2023-08-27 DIAGNOSIS — F3281 Premenstrual dysphoric disorder: Secondary | ICD-10-CM | POA: Diagnosis not present

## 2023-08-27 DIAGNOSIS — F603 Borderline personality disorder: Secondary | ICD-10-CM | POA: Diagnosis not present

## 2023-09-03 DIAGNOSIS — F603 Borderline personality disorder: Secondary | ICD-10-CM | POA: Diagnosis not present

## 2023-09-03 DIAGNOSIS — F310 Bipolar disorder, current episode hypomanic: Secondary | ICD-10-CM | POA: Diagnosis not present

## 2023-09-03 DIAGNOSIS — F3281 Premenstrual dysphoric disorder: Secondary | ICD-10-CM | POA: Diagnosis not present

## 2023-09-09 DIAGNOSIS — F310 Bipolar disorder, current episode hypomanic: Secondary | ICD-10-CM | POA: Diagnosis not present

## 2023-09-09 DIAGNOSIS — F3281 Premenstrual dysphoric disorder: Secondary | ICD-10-CM | POA: Diagnosis not present

## 2023-09-09 DIAGNOSIS — F603 Borderline personality disorder: Secondary | ICD-10-CM | POA: Diagnosis not present

## 2023-09-24 DIAGNOSIS — F310 Bipolar disorder, current episode hypomanic: Secondary | ICD-10-CM | POA: Diagnosis not present

## 2023-09-24 DIAGNOSIS — F3281 Premenstrual dysphoric disorder: Secondary | ICD-10-CM | POA: Diagnosis not present

## 2023-09-24 DIAGNOSIS — F603 Borderline personality disorder: Secondary | ICD-10-CM | POA: Diagnosis not present

## 2023-10-08 DIAGNOSIS — F3281 Premenstrual dysphoric disorder: Secondary | ICD-10-CM | POA: Diagnosis not present

## 2023-10-08 DIAGNOSIS — F310 Bipolar disorder, current episode hypomanic: Secondary | ICD-10-CM | POA: Diagnosis not present

## 2023-10-08 DIAGNOSIS — F603 Borderline personality disorder: Secondary | ICD-10-CM | POA: Diagnosis not present

## 2023-10-15 DIAGNOSIS — F603 Borderline personality disorder: Secondary | ICD-10-CM | POA: Diagnosis not present

## 2023-10-15 DIAGNOSIS — F310 Bipolar disorder, current episode hypomanic: Secondary | ICD-10-CM | POA: Diagnosis not present

## 2023-10-15 DIAGNOSIS — F3281 Premenstrual dysphoric disorder: Secondary | ICD-10-CM | POA: Diagnosis not present

## 2023-10-22 DIAGNOSIS — F603 Borderline personality disorder: Secondary | ICD-10-CM | POA: Diagnosis not present

## 2023-10-22 DIAGNOSIS — F3281 Premenstrual dysphoric disorder: Secondary | ICD-10-CM | POA: Diagnosis not present

## 2023-10-22 DIAGNOSIS — F310 Bipolar disorder, current episode hypomanic: Secondary | ICD-10-CM | POA: Diagnosis not present

## 2023-10-29 DIAGNOSIS — F310 Bipolar disorder, current episode hypomanic: Secondary | ICD-10-CM | POA: Diagnosis not present

## 2023-10-29 DIAGNOSIS — F603 Borderline personality disorder: Secondary | ICD-10-CM | POA: Diagnosis not present

## 2023-10-29 DIAGNOSIS — F3281 Premenstrual dysphoric disorder: Secondary | ICD-10-CM | POA: Diagnosis not present

## 2023-11-05 DIAGNOSIS — F603 Borderline personality disorder: Secondary | ICD-10-CM | POA: Diagnosis not present

## 2023-11-05 DIAGNOSIS — F310 Bipolar disorder, current episode hypomanic: Secondary | ICD-10-CM | POA: Diagnosis not present

## 2023-11-05 DIAGNOSIS — F3281 Premenstrual dysphoric disorder: Secondary | ICD-10-CM | POA: Diagnosis not present

## 2023-11-12 DIAGNOSIS — F603 Borderline personality disorder: Secondary | ICD-10-CM | POA: Diagnosis not present

## 2023-11-12 DIAGNOSIS — F3281 Premenstrual dysphoric disorder: Secondary | ICD-10-CM | POA: Diagnosis not present

## 2023-11-12 DIAGNOSIS — F310 Bipolar disorder, current episode hypomanic: Secondary | ICD-10-CM | POA: Diagnosis not present

## 2023-12-10 DIAGNOSIS — F3281 Premenstrual dysphoric disorder: Secondary | ICD-10-CM | POA: Diagnosis not present

## 2023-12-10 DIAGNOSIS — F603 Borderline personality disorder: Secondary | ICD-10-CM | POA: Diagnosis not present

## 2023-12-10 DIAGNOSIS — F310 Bipolar disorder, current episode hypomanic: Secondary | ICD-10-CM | POA: Diagnosis not present

## 2023-12-17 DIAGNOSIS — F310 Bipolar disorder, current episode hypomanic: Secondary | ICD-10-CM | POA: Diagnosis not present

## 2023-12-17 DIAGNOSIS — F603 Borderline personality disorder: Secondary | ICD-10-CM | POA: Diagnosis not present

## 2023-12-17 DIAGNOSIS — F3281 Premenstrual dysphoric disorder: Secondary | ICD-10-CM | POA: Diagnosis not present

## 2023-12-24 DIAGNOSIS — F603 Borderline personality disorder: Secondary | ICD-10-CM | POA: Diagnosis not present

## 2023-12-24 DIAGNOSIS — F310 Bipolar disorder, current episode hypomanic: Secondary | ICD-10-CM | POA: Diagnosis not present

## 2023-12-24 DIAGNOSIS — F3281 Premenstrual dysphoric disorder: Secondary | ICD-10-CM | POA: Diagnosis not present

## 2024-01-07 DIAGNOSIS — F603 Borderline personality disorder: Secondary | ICD-10-CM | POA: Diagnosis not present

## 2024-01-07 DIAGNOSIS — F310 Bipolar disorder, current episode hypomanic: Secondary | ICD-10-CM | POA: Diagnosis not present

## 2024-01-07 DIAGNOSIS — F3281 Premenstrual dysphoric disorder: Secondary | ICD-10-CM | POA: Diagnosis not present

## 2024-02-04 DIAGNOSIS — F310 Bipolar disorder, current episode hypomanic: Secondary | ICD-10-CM | POA: Diagnosis not present

## 2024-02-04 DIAGNOSIS — F3281 Premenstrual dysphoric disorder: Secondary | ICD-10-CM | POA: Diagnosis not present

## 2024-02-04 DIAGNOSIS — F603 Borderline personality disorder: Secondary | ICD-10-CM | POA: Diagnosis not present

## 2024-02-16 DIAGNOSIS — F3281 Premenstrual dysphoric disorder: Secondary | ICD-10-CM | POA: Diagnosis not present

## 2024-02-16 DIAGNOSIS — F310 Bipolar disorder, current episode hypomanic: Secondary | ICD-10-CM | POA: Diagnosis not present

## 2024-02-16 DIAGNOSIS — F603 Borderline personality disorder: Secondary | ICD-10-CM | POA: Diagnosis not present

## 2024-03-01 DIAGNOSIS — F603 Borderline personality disorder: Secondary | ICD-10-CM | POA: Diagnosis not present

## 2024-03-01 DIAGNOSIS — F310 Bipolar disorder, current episode hypomanic: Secondary | ICD-10-CM | POA: Diagnosis not present

## 2024-03-01 DIAGNOSIS — F3281 Premenstrual dysphoric disorder: Secondary | ICD-10-CM | POA: Diagnosis not present

## 2024-08-30 DIAGNOSIS — F310 Bipolar disorder, current episode hypomanic: Secondary | ICD-10-CM | POA: Diagnosis not present

## 2024-08-30 DIAGNOSIS — F3281 Premenstrual dysphoric disorder: Secondary | ICD-10-CM | POA: Diagnosis not present

## 2024-08-30 DIAGNOSIS — F603 Borderline personality disorder: Secondary | ICD-10-CM | POA: Diagnosis not present

## 2024-09-04 DIAGNOSIS — Z6831 Body mass index (BMI) 31.0-31.9, adult: Secondary | ICD-10-CM | POA: Diagnosis not present

## 2024-09-04 DIAGNOSIS — J209 Acute bronchitis, unspecified: Secondary | ICD-10-CM | POA: Diagnosis not present

## 2024-09-04 DIAGNOSIS — J0100 Acute maxillary sinusitis, unspecified: Secondary | ICD-10-CM | POA: Diagnosis not present
